# Patient Record
Sex: Male | Born: 1960 | Race: White | Hispanic: No | Marital: Married | Smoking: Never smoker
Health system: Southern US, Community
[De-identification: ages and names within clinical notes are randomized; demographics above are authoritative.]

## PROBLEM LIST (undated history)

## (undated) DIAGNOSIS — M1990 Unspecified osteoarthritis, unspecified site: Secondary | ICD-10-CM

## (undated) DIAGNOSIS — T7840XA Allergy, unspecified, initial encounter: Secondary | ICD-10-CM

## (undated) DIAGNOSIS — I10 Essential (primary) hypertension: Secondary | ICD-10-CM

## (undated) DIAGNOSIS — E785 Hyperlipidemia, unspecified: Secondary | ICD-10-CM

## (undated) DIAGNOSIS — I6529 Occlusion and stenosis of unspecified carotid artery: Secondary | ICD-10-CM

## (undated) DIAGNOSIS — Z9289 Personal history of other medical treatment: Secondary | ICD-10-CM

## (undated) HISTORY — PX: OTHER SURGICAL HISTORY: SHX169

## (undated) HISTORY — DX: Personal history of other medical treatment: Z92.89

## (undated) HISTORY — DX: Essential (primary) hypertension: I10

## (undated) HISTORY — DX: Hyperlipidemia, unspecified: E78.5

## (undated) HISTORY — DX: Occlusion and stenosis of unspecified carotid artery: I65.29

## (undated) HISTORY — DX: Unspecified osteoarthritis, unspecified site: M19.90

## (undated) HISTORY — DX: Allergy, unspecified, initial encounter: T78.40XA

## (undated) HISTORY — PX: TONSILLECTOMY: SUR1361

---

## 2003-03-13 ENCOUNTER — Encounter: Admission: RE | Admit: 2003-03-13 | Discharge: 2003-03-13 | Payer: Self-pay | Admitting: Emergency Medicine

## 2003-03-13 ENCOUNTER — Encounter: Payer: Self-pay | Admitting: Emergency Medicine

## 2011-08-31 ENCOUNTER — Ambulatory Visit (INDEPENDENT_AMBULATORY_CARE_PROVIDER_SITE_OTHER): Payer: 59 | Admitting: Physician Assistant

## 2011-08-31 VITALS — BP 155/81 | HR 73 | Temp 98.8°F | Resp 18 | Ht 65.5 in | Wt 226.4 lb

## 2011-08-31 DIAGNOSIS — J40 Bronchitis, not specified as acute or chronic: Secondary | ICD-10-CM

## 2011-08-31 DIAGNOSIS — R059 Cough, unspecified: Secondary | ICD-10-CM

## 2011-08-31 DIAGNOSIS — R05 Cough: Secondary | ICD-10-CM

## 2011-08-31 MED ORDER — AZITHROMYCIN 250 MG PO TABS: ORAL_TABLET | ORAL | Status: AC

## 2011-08-31 MED ORDER — IPRATROPIUM BROMIDE 0.06 % NA SOLN: 2.0000 | Freq: Three times a day (TID) | NASAL | Status: DC

## 2011-08-31 MED ORDER — HYDROCODONE-HOMATROPINE 5-1.5 MG/5ML PO SYRP: ORAL_SOLUTION | ORAL | Status: AC

## 2011-08-31 NOTE — Progress Notes (Signed)
Patient ID: Alexander Lamb MRN: [id number], DOB: Aug 25, 1960, 51 y.o. Date of Encounter: 08/31/2011, 10:32 AM  Primary Physician: Deboraha Sprang at [facility]  Chief Complaint:  Chief Complaint  Patient presents with  . Cough    x 5 days non productive  . Sore Throat  . Nasal Congestion    yellow mucous    HPI: 51 y.o. year old male presents with 5 day history of worsening sore throat, nasal congestion, post nasal drip, and cough. Afebrile. No chills. Nasal congestion thick and yellow. Cough nonproductive, worse as the day progresses. No wheeze or SOB. Mild sinus pressure. Normal hearing. Normal appetite. No GI complaints.   No leg trauma, sedentary periods, h/o cancer, or tobacco use.  No past medical history on file.   Home Meds: Prior to Admission medications   Medication Sig Start Date End Date Taking? Authorizing Provider  clomiPHENE (CLOMID) 50 MG tablet Take 50 mg by mouth daily.   Yes Historical Provider, MD  ibuprofen (ADVIL,MOTRIN) 200 MG tablet Take 200 mg by mouth as needed.   Yes Historical Provider, MD  naproxen sodium (ANAPROX) 220 MG tablet Take 220 mg by mouth as needed.   Yes Historical Provider, MD  simvastatin (ZOCOR) 20 MG tablet Take 20 mg by mouth every evening.   Yes Historical Provider, MD    Allergies: No Known Allergies  History   Social History  . Marital Status: Married    Spouse Name: N/A    Number of Children: N/A  . Years of Education: N/A   Occupational History  . Not on file.   Social History Main Topics  . Smoking status: Never Smoker   . Smokeless tobacco: Not on file  . Alcohol Use: Not on file  . Drug Use: Not on file  . Sexually Active: Not on file   Other Topics Concern  . Not on file   Social History Narrative  . No narrative on file     Review of Systems: Constitutional: negative for chills, fever, night sweats or weight changes Cardiovascular: negative for chest pain or palpitations Respiratory: negative for hemoptysis,  wheezing, or shortness of breath Abdominal: negative for abdominal pain, nausea, vomiting or diarrhea Dermatological: negative for rash Neurologic: negative for headache   Physical Exam: Blood pressure 155/81, pulse 73, temperature 98.8 F (37.1 C), temperature source Oral, resp. rate 18, height 5' 5.5" (1.664 m), weight 226 lb 6.4 oz (102.694 kg), SpO2 99.00%., Body mass index is 37.10 kg/(m^2). General: Well developed, well nourished, in no acute distress. Head: Normocephalic, atraumatic, eyes without discharge, sclera non-icteric, nares are congested. Bilateral auditory canals clear, TM's are without perforation, pearly grey with reflective cone of light bilaterally. Serous effusion bilaterally behind TM's. Maxillary sinus TTP. Oral cavity moist, dentition normal. Posterior pharynx with post nasal drip and mild erythema. No peritonsillar abscess or tonsillar exudate. Neck: Supple. No thyromegaly. Full ROM. No lymphadenopathy. Lungs: Clear bilaterally to auscultation without wheezes, rales, or rhonchi. Breathing is unlabored. Heart: RRR with S1 S2. No murmurs, rubs, or gallops appreciated. Msk:  Strength and tone normal for age. Extremities: No clubbing or cyanosis. No edema. Neuro: Alert and oriented X 3. Moves all extremities spontaneously. CNII-XII grossly in tact. Psych:  Responds to questions appropriately with a normal affect.     ASSESSMENT AND PLAN:  51 y.o. year old male with early bronchitis -Azithromycin 250 MG #6 2 po first day then 1 po next 4 days no RF -Atrovent NS 0.06% 2 sprays each nare  bid prn #1 no RF -Hycodan #4oz 1 tsp po q 4-6 hours prn cough no RF SED -Mucinex -Tylenol/Motrin prn -Rest/fluids -RTC precautions -RTC 3-5 days if no improvement  Signed, Eula Listen, PA-C 08/31/2011 10:32 AM

## 2012-02-07 ENCOUNTER — Ambulatory Visit (INDEPENDENT_AMBULATORY_CARE_PROVIDER_SITE_OTHER): Payer: 59 | Admitting: Family Medicine

## 2012-02-07 VITALS — BP 125/72 | HR 69 | Temp 98.4°F | Resp 16 | Ht 65.5 in | Wt 222.0 lb

## 2012-02-07 DIAGNOSIS — R300 Dysuria: Secondary | ICD-10-CM

## 2012-02-07 LAB — POCT URINALYSIS DIPSTICK
Bilirubin, UA: NEGATIVE
Blood, UA: NEGATIVE
Glucose, UA: NEGATIVE
Ketones, UA: NEGATIVE
Leukocytes, UA: NEGATIVE
Nitrite, UA: NEGATIVE
Protein, UA: NEGATIVE
Spec Grav, UA: 1.01
Urobilinogen, UA: 0.2
pH, UA: 6.5

## 2012-02-07 LAB — POCT UA - MICROSCOPIC ONLY
Bacteria, U Microscopic: NEGATIVE
Casts, Ur, LPF, POC: NEGATIVE
Crystals, Ur, HPF, POC: NEGATIVE
Mucus, UA: NEGATIVE
RBC, urine, microscopic: NEGATIVE
Yeast, UA: NEGATIVE

## 2012-02-07 MED ORDER — CIPROFLOXACIN HCL 500 MG PO TABS: 500.0000 mg | ORAL_TABLET | Freq: Two times a day (BID) | ORAL | Status: AC

## 2012-02-07 NOTE — Progress Notes (Signed)
[facility] [address], [place] [address] [phone]  Date:  02/07/2012   Name:  Alexander Lamb   DOB:  [birth year]   MRN:  [id number]  PCP:  No primary provider on file.    Chief Complaint: Dysuria   History of Present Illness:  Alexander Lamb is a 51 y.o. very pleasant male patient who presents with the following:  He is concerned that he may have a UTI.  He noted dysuria and urgency/ frequency last night for about an hour.  It then seemed to get better and currently he feels ok.   He has had a UTI in the past- once or twice. Once was found incidentally on a UA.   He has felt a little tired today. No fever or chills.    He does PT at a SNF.   His back feels a little tight- this is not new however.  He is little queasy, but no vomiting or abd pain.    He has not noted any penile discharge, no new sexual partners.   No history of kidney stones- no family history of same that he knows of There is no problem list on file for this patient.   No past medical history on file.  No past surgical history on file.  History  Substance Use Topics  . Smoking status: Never Smoker   . Smokeless tobacco: Not on file  . Alcohol Use: Not on file    No family history on file.  Allergies  Allergen Reactions  . Penicillins Rash    Medication list has been reviewed and updated.  Current Outpatient Prescriptions on File Prior to Visit  Medication Sig Dispense Refill  . clomiPHENE (CLOMID) 50 MG tablet Take 50 mg by mouth daily.      . simvastatin (ZOCOR) 20 MG tablet Take 20 mg by mouth every evening.        Review of Systems:  As per HPI- otherwise negative.   Physical Examination: Filed Vitals:   02/07/12 0857  BP: 125/72  Pulse: 69  Temp: 98.4 F (36.9 C)  Resp: 16   Filed Vitals:   02/07/12 0857  Height: 5' 5.5" (1.664 m)  Weight: 222 lb (100.699 kg)   Body mass index is 36.38 kg/(m^2). Ideal Body Weight: Weight in (lb) to have BMI =  25: 152.2   GEN: WDWN, NAD, Non-toxic, A & O x 3 HEENT: Atraumatic, Normocephalic. Neck supple. No masses, No LAD. Ears and Nose: No external deformity. CV: RRR, No M/G/R. No JVD. No thrill. No extra heart sounds. PULM: CTA B, no wheezes, crackles, rhonchi. No retractions. No resp. distress. No accessory muscle use. ABD: S, NT, ND, +BS. No rebound. No HSM.  No CVA tenderness GU: normal, no masses or discharge EXTR: No c/c/e NEURO Normal gait.  PSYCH: Normally interactive. Conversant. Not depressed or anxious appearing.  Calm demeanor.   Results for orders placed in visit on 02/07/12  POCT URINALYSIS DIPSTICK      Component Value Range   Color, UA yellow     Clarity, UA clear     Glucose, UA neg     Bilirubin, UA neg     Ketones, UA neg     Spec Grav, UA 1.010     Blood, UA neg     pH, UA 6.5     Protein, UA neg     Urobilinogen, UA 0.2     Nitrite, UA neg  Leukocytes, UA Negative    POCT UA - MICROSCOPIC ONLY      Component Value Range   WBC, Ur, HPF, POC rare     RBC, urine, microscopic neg     Bacteria, U Microscopic neg     Mucus, UA neg     Epithelial cells, urine per micros rare     Crystals, Ur, HPF, POC neg     Casts, Ur, LPF, POC neg     Yeast, UA neg       Assessment and Plan: 1. Dysuria  POCT urinalysis dipstick, POCT UA - Microscopic Only, ciprofloxacin (CIPRO) 500 MG tablet, Urine culture   [name] may have passed a stone vs early UTI.  Will start cipro while we await his urine culture, he will also use a urine screen if his symptoms return.  Will plan further follow- up pending labs. Let me know if any problems in the meantime.    Abbe Amsterdam, MD

## 2012-02-08 LAB — URINE CULTURE
Colony Count: NO GROWTH
Organism ID, Bacteria: NO GROWTH

## 2012-02-10 ENCOUNTER — Encounter: Payer: Self-pay | Admitting: Family Medicine

## 2012-06-09 ENCOUNTER — Ambulatory Visit (INDEPENDENT_AMBULATORY_CARE_PROVIDER_SITE_OTHER): Payer: 59 | Admitting: Family Medicine

## 2012-06-09 VITALS — BP 118/76 | HR 82 | Temp 98.7°F | Resp 18 | Ht 66.0 in | Wt 217.0 lb

## 2012-06-09 DIAGNOSIS — K529 Noninfective gastroenteritis and colitis, unspecified: Secondary | ICD-10-CM

## 2012-06-09 DIAGNOSIS — K5289 Other specified noninfective gastroenteritis and colitis: Secondary | ICD-10-CM

## 2012-06-09 DIAGNOSIS — R110 Nausea: Secondary | ICD-10-CM

## 2012-06-09 DIAGNOSIS — R109 Unspecified abdominal pain: Secondary | ICD-10-CM

## 2012-06-09 DIAGNOSIS — R197 Diarrhea, unspecified: Secondary | ICD-10-CM

## 2012-06-09 MED ORDER — ONDANSETRON 4 MG PO TBDP: 4.0000 mg | ORAL_TABLET | Freq: Three times a day (TID) | ORAL | Status: DC | PRN

## 2012-06-09 MED ORDER — DICYCLOMINE HCL 10 MG PO CAPS: 10.0000 mg | ORAL_CAPSULE | Freq: Three times a day (TID) | ORAL | Status: DC

## 2012-06-09 NOTE — Patient Instructions (Signed)
Drink plenty of fluids  Gradually progress diet as discussed  Bentyl 10 mg (20 if needed) 4 times daily and for cramping or diarrhea  Return if abruptly worse

## 2012-06-09 NOTE — Progress Notes (Signed)
Subjective The 51 year old man is here for an intestinal in fraction. He started getting sick Saturday afternoon with nausea and vomiting. He last vomited on Sunday morning. He is persisted in having diarrhea, about 8 times yesterday and once this morning. He has been cramping intermittently. He ate some Jell-O and drink liquids yesterday. 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  Objective: Pleasant alert man in no major distress. Throat clear. Mucous membranes are moist.. Soft without masses. Mild generalized tenderness. Skin is warm and dry.  Assessment: Gastroenteritis. With nausea diarrhea and cramping  Plan: Bentyl 10 mg 4 times a day when necessary  Zofran for when necessary use  Progress diet slowly.  Return if problems.

## 2012-10-31 ENCOUNTER — Ambulatory Visit: Payer: Self-pay | Admitting: Cardiovascular Disease

## 2013-01-01 ENCOUNTER — Ambulatory Visit (INDEPENDENT_AMBULATORY_CARE_PROVIDER_SITE_OTHER): Payer: BC Managed Care – PPO | Admitting: Cardiovascular Disease

## 2013-01-01 ENCOUNTER — Encounter: Payer: Self-pay | Admitting: Cardiovascular Disease

## 2013-01-01 VITALS — BP 142/92 | HR 67 | Ht 67.0 in | Wt 226.4 lb

## 2013-01-01 DIAGNOSIS — E782 Mixed hyperlipidemia: Secondary | ICD-10-CM

## 2013-01-01 LAB — HEPATIC FUNCTION PANEL
ALT: 44 U/L (ref 0–53)
AST: 59 U/L — ABNORMAL HIGH (ref 0–37)
Albumin: 4.1 g/dL (ref 3.5–5.2)
Alkaline Phosphatase: 46 U/L (ref 39–117)
Bilirubin, Direct: 0.3 mg/dL (ref 0.0–0.3)
Total Bilirubin: 1.5 mg/dL — ABNORMAL HIGH (ref 0.3–1.2)
Total Protein: 7.3 g/dL (ref 6.0–8.3)

## 2013-01-01 LAB — LIPID PANEL
Cholesterol: 162 mg/dL (ref 0–200)
HDL: 40.2 mg/dL (ref >39.00)
LDL Cholesterol: 87 mg/dL (ref 0–99)
Total CHOL/HDL Ratio: 4
Triglycerides: 173 mg/dL — ABNORMAL HIGH (ref 0.0–149.0)
VLDL: 34.6 mg/dL (ref 0.0–40.0)

## 2013-01-01 LAB — BASIC METABOLIC PANEL
BUN: 20 mg/dL (ref 6–23)
CO2: 23 mEq/L (ref 19–32)
Calcium: 8.6 mg/dL (ref 8.4–10.5)
Chloride: 107 mEq/L (ref 96–112)
Creatinine, Ser: 1.2 mg/dL (ref 0.4–1.5)
GFR: 69.61 mL/min (ref >60.00)
Glucose, Bld: 117 mg/dL — ABNORMAL HIGH (ref 70–99)
Potassium: 4.8 mEq/L (ref 3.5–5.1)
Sodium: 140 mEq/L (ref 135–145)

## 2013-01-01 NOTE — Patient Instructions (Addendum)
Your physician recommends that you have a FASTING LIPID, LIVER and BMP today.  Your physician wants you to follow-up in: 3 MONTHS with Dr Excell Seltzer.  You will receive a reminder letter in the mail two months in advance. If you don't receive a letter, please call our office to schedule the follow-up appointment.  Your physician recommends that you return for a FASTING lipid profile in 3 MONTHS--nothing to eat or drink after midnight, lab opens at 7:30 (please have labs one week prior to follow-up appointment)

## 2013-01-01 NOTE — Progress Notes (Signed)
HPI:  52 year old gentleman presents for cardiovascular evaluation. The patient has hyperlipidemia and has been treated with simvastatin since 2007. He's never had cardiac problems before. He doesn't think labs have been drawn in the past few years and I cannot find any record of his lipids. He's been noted to have low testosterone and is being treated with clomiphene. He is referred for consideration of whether he needs to continue on statin therapy as this is been associated with low testosterone levels.  The patient feels much better since starting clomiphene. His energy level is dramatically improved. He does power lifting and fairly hard exercise. He denies exertional chest pain or pressure, palpitations, or lightheadedness. He has expected dyspnea with heavy exertion, but no shortness of breath with normal or moderate level activity. He essentially feels well and is without complaints. He denies edema, orthopnea, or PND. He has no history of chest pain, myocardial infarction, stroke, or TIA. There is no family history of premature coronary artery disease. However, he admits his father's medical history is completely unknown as he has had no contact with him.  Outpatient Encounter Prescriptions as of 01/01/2013  Medication Sig Dispense Refill  . aspirin-acetaminophen-caffeine (EXCEDRIN MIGRAINE) 250-250-65 MG per tablet Take 1 tablet by mouth every 6 (six) hours as needed for pain.      . clomiPHENE (CLOMID) 50 MG tablet Take 50 mg by mouth daily.      . Melatonin 5 MG TABS Take 5 mg by mouth at bedtime as needed.      . naproxen sodium (ANAPROX) 220 MG tablet Take 220 mg by mouth as needed.      . S-Adenosylmethionine (SAM-E) 200 MG TABS Take 400 mg by mouth once a week.      . simvastatin (ZOCOR) 20 MG tablet Take 20 mg by mouth every evening.      . vitamin B-12 (CYANOCOBALAMIN) 50 MCG tablet Take 50 mcg by mouth daily.      . [DISCONTINUED] dicyclomine (BENTYL) 10 MG capsule Take 1 capsule  (10 mg total) by mouth 4 (four) times daily -  before meals and at bedtime.  20 capsule  0  . [DISCONTINUED] ondansetron (ZOFRAN ODT) 4 MG disintegrating tablet Take 1 tablet (4 mg total) by mouth every 8 (eight) hours as needed for nausea.  10 tablet  0   No facility-administered encounter medications on file as of 01/01/2013.    Penicillins  Past Medical History  Diagnosis Date  . Allergy   . Arthritis   . Hyperlipidemia     History reviewed. No pertinent past surgical history.  History   Social History  . Marital Status: Married    Spouse Name: N/A    Number of Children: N/A  . Years of Education: N/A   Occupational History  . Not on file.   Social History Main Topics  . Smoking status: Never Smoker   . Smokeless tobacco: Not on file  . Alcohol Use: 0.0 oz/week    1-2 drink(s) per week  . Drug Use: No  . Sexually Active: Yes    Birth Control/ Protection: None   Other Topics Concern  . Not on file   Social History Narrative  . No narrative on file    Family History  Problem Relation Age of Onset  . Arthritis Mother   . COPD Mother   . Fibromyalgia Mother   . Cancer Maternal Grandmother     ROS:  General: no fevers/chills/night sweats, weight has been stable  for many years Eyes: no blurry vision, diplopia, or amaurosis ENT: no sore throat or hearing loss Resp: no cough, wheezing, or hemoptysis CV: no edema or palpitations GI: no abdominal pain, nausea, vomiting, diarrhea, or constipation GU: no dysuria, frequency, or hematuria Skin: no rash Neuro: no headache, numbness, tingling, or weakness of extremities Musculoskeletal: Positive for left hip Heme: no bleeding, DVT, or easy bruising Endo: no polydipsia or polyuria  BP 142/92  Pulse 67  Ht 5\' 7"  (1.702 m)  Wt 226 lb 6.4 oz (102.694 kg)  BMI 35.45 kg/m2  PHYSICAL EXAM: Pt is alert and oriented, WD, WN, in no distress. HEENT: normal Neck: JVP normal. Carotid upstrokes normal without bruits. No  thyromegaly. Lungs: equal expansion, clear bilaterally CV: Apex is discrete and nondisplaced, RRR without murmur or gallop Abd: soft, NT, +BS, no bruit, no hepatosplenomegaly Back: no CVA tenderness Ext: no C/C/E        Femoral pulses 2+= without bruits        DP/PT pulses intact and = Skin: warm and dry without rash Neuro: CNII-XII intact             Strength intact = bilaterally  EKG:  Normal sinus rhythm 67 beats per minute, within normal limits  ASSESSMENT AND PLAN: Hyperlipidemia. Lipids are unknown, but the patient is on chronic simvastatin. We'll check a lipid panel and a full metabolic panel today. This will help guide up plan with consideration of discontinuing simvastatin. I have reviewed the balance between the cardiovascular risk of statin therapy and primary prevention with some of the negative effects including association with low testosterone. The patient has also noted hyperglycemia in the past and we will check his fasting glucose. As long as his lipids are reasonable, will consider a trial off of statin therapy with repeat labs and followup visit in 3 months for further discussion. We had extensive discussion about a shift from heavy weight lifting towards more aerobic exercise as well as a focus on dietary changes and weight loss.  Alexander Lamb 01/01/2013 10:19 AM

## 2013-01-06 ENCOUNTER — Other Ambulatory Visit: Payer: Self-pay

## 2013-01-06 DIAGNOSIS — E782 Mixed hyperlipidemia: Secondary | ICD-10-CM

## 2013-01-08 ENCOUNTER — Other Ambulatory Visit: Payer: Self-pay

## 2013-01-08 ENCOUNTER — Encounter: Payer: Self-pay | Admitting: Cardiovascular Disease

## 2013-01-08 NOTE — Telephone Encounter (Signed)
New problem  Pt is calling regarding lab work.

## 2013-01-08 NOTE — Telephone Encounter (Signed)
This encounter was created in error - please disregard.

## 2013-05-06 ENCOUNTER — Telehealth: Payer: Self-pay | Admitting: Cardiovascular Disease

## 2013-05-06 DIAGNOSIS — E782 Mixed hyperlipidemia: Secondary | ICD-10-CM

## 2013-05-06 NOTE — Telephone Encounter (Signed)
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  One of the NPs took the patient's BP which was 170/110 in right arm and higher in left arm but he cannot remember number.  Patient was taken to urgent care where his BP was verified as being 170/110.  Patient sat and rested for an hour; BP was rechecked and was 138/85.  Patient states he is feeling better now but would like to be seen in the office soon.  I advised patient that Dr. Excell Seltzer does not have any appointments until January.  I scheduled patient with Tereso Newcomer, PA-C on 11/12 and for fasting lipid profile same day, which Dr. Excell Seltzer wanted to repeat in October.  Patient verbalized understanding and agreement and thanked me for my help.

## 2013-05-06 NOTE — Telephone Encounter (Signed)
New Problem     11/5 pt felt sick today flushed face, tired, dizzines, headache,  BP 170/110 right arm.    Pt would like to sees some one cell # [phone].   Co worker took pt to 11/5 [facility] # [phone] in [place] today they did EKG/BP.   Pt needs and appointment.

## 2013-05-07 ENCOUNTER — Encounter: Payer: Self-pay | Admitting: *Deleted

## 2013-05-13 ENCOUNTER — Ambulatory Visit (INDEPENDENT_AMBULATORY_CARE_PROVIDER_SITE_OTHER): Payer: BC Managed Care – PPO | Admitting: Physician Assistant

## 2013-05-13 ENCOUNTER — Ambulatory Visit (HOSPITAL_COMMUNITY): Payer: BC Managed Care – PPO | Attending: Cardiovascular Disease

## 2013-05-13 ENCOUNTER — Encounter: Payer: Self-pay | Admitting: Physician Assistant

## 2013-05-13 ENCOUNTER — Other Ambulatory Visit: Payer: BC Managed Care – PPO

## 2013-05-13 VITALS — BP 140/80 | HR 72 | Ht 67.0 in | Wt 200.0 lb

## 2013-05-13 DIAGNOSIS — R0989 Other specified symptoms and signs involving the circulatory and respiratory systems: Secondary | ICD-10-CM

## 2013-05-13 DIAGNOSIS — IMO0001 Reserved for inherently not codable concepts without codable children: Secondary | ICD-10-CM

## 2013-05-13 DIAGNOSIS — R011 Cardiac murmur, unspecified: Secondary | ICD-10-CM

## 2013-05-13 DIAGNOSIS — I6529 Occlusion and stenosis of unspecified carotid artery: Secondary | ICD-10-CM

## 2013-05-13 DIAGNOSIS — E785 Hyperlipidemia, unspecified: Secondary | ICD-10-CM

## 2013-05-13 DIAGNOSIS — I10 Essential (primary) hypertension: Secondary | ICD-10-CM | POA: Insufficient documentation

## 2013-05-13 DIAGNOSIS — R030 Elevated blood-pressure reading, without diagnosis of hypertension: Secondary | ICD-10-CM

## 2013-05-13 DIAGNOSIS — I658 Occlusion and stenosis of other precerebral arteries: Secondary | ICD-10-CM | POA: Insufficient documentation

## 2013-05-13 LAB — HEPATIC FUNCTION PANEL
ALT: 51 U/L (ref 0–53)
AST: 60 U/L — ABNORMAL HIGH (ref 0–37)
Albumin: 4.3 g/dL (ref 3.5–5.2)
Alkaline Phosphatase: 55 U/L (ref 39–117)
Bilirubin, Direct: 0.1 mg/dL (ref 0.0–0.3)
Total Bilirubin: 1.2 mg/dL (ref 0.3–1.2)
Total Protein: 7.3 g/dL (ref 6.0–8.3)

## 2013-05-13 LAB — LIPID PANEL
Cholesterol: 205 mg/dL — ABNORMAL HIGH (ref 0–200)
HDL: 50.9 mg/dL (ref >39.00)
Total CHOL/HDL Ratio: 4
Triglycerides: 114 mg/dL (ref 0.0–149.0)
VLDL: 22.8 mg/dL (ref 0.0–40.0)

## 2013-05-13 LAB — BASIC METABOLIC PANEL
BUN: 18 mg/dL (ref 6–23)
CO2: 28 mEq/L (ref 19–32)
Calcium: 9.1 mg/dL (ref 8.4–10.5)
Chloride: 103 mEq/L (ref 96–112)
Creatinine, Ser: 1.1 mg/dL (ref 0.4–1.5)
GFR: 74.64 mL/min (ref >60.00)
Glucose, Bld: 92 mg/dL (ref 70–99)
Potassium: 3.8 mEq/L (ref 3.5–5.1)
Sodium: 139 mEq/L (ref 135–145)

## 2013-05-13 LAB — LDL CHOLESTEROL, DIRECT: Direct LDL: 140 mg/dL

## 2013-05-13 NOTE — Progress Notes (Signed)
[address] [place], [place]  [address] Phone: [phone] Fax:  [phone]  Date:  05/13/2013   ID:  Alexander Lamb, DOB 05/16/1961, MRN [id number]  PCP:  Janace Hoard, MD  Cardiologist:  Dr. Tonny Bollman     History of Present Illness: Alexander Lamb is a 52 y.o. male with a history of hyperlipidemia and hypogonadism.  He was initially evaluated by Dr. Excell Seltzer in 12/2012.  He had been on simvastatin. Decision was made to stop this and repeat his labs in 3 months. Of note, his sugar was mildly elevated and diet and weight loss was stressed.  He called in recently with complaints of elevated blood pressure while at work (170/110). He was symptomatic with facial flushing, dizziness and headache.  Patient tells me that he had recently started on a creatine supplement to augment his weight training. He also has been taking NSAIDs on a fairly frequent basis. He had his flu shot about 2 days before his episode of hypertension. He started to feel poorly 2 days after his vaccination. As noted, he did have nausea and headaches with his increased blood pressure. He was taken to an urgent care but no other significant testing was performed. Patient denies associated chest pain or shortness of breath. He denies syncope, orthopnea, PND or edema. He denies exertional chest discomfort.  Recent Labs: 01/01/2013: ALT 44; Creatinine 1.2; HDL 40.20; LDL (calc) 87; Potassium 4.8   Wt Readings from Last 3 Encounters:  05/13/13 200 lb (90.719 kg)  01/01/13 226 lb 6.4 oz (102.694 kg)  06/09/12 217 lb (98.431 kg)     Past Medical History  Diagnosis Date  . Allergy   . Arthritis   . Hyperlipidemia     Current Outpatient Prescriptions  Medication Sig Dispense Refill  . aspirin-acetaminophen-caffeine (EXCEDRIN MIGRAINE) 250-250-65 MG per tablet Take 1 tablet by mouth every 6 (six) hours as needed for pain.      . clomiPHENE (CLOMID) 50 MG tablet Take 50 mg by mouth daily.      . Melatonin 5  MG TABS Take 5 mg by mouth at bedtime as needed.      . naproxen sodium (ANAPROX) 220 MG tablet Take 220 mg by mouth as needed.      . S-Adenosylmethionine (SAM-E) 200 MG TABS Take 400 mg by mouth once a week.      . vitamin B-12 (CYANOCOBALAMIN) 50 MCG tablet Take 50 mcg by mouth daily.       No current facility-administered medications for this visit.    Allergies:   Penicillins   Social History:  The patient  reports that he has never smoked. He does not have any smokeless tobacco history on file. He reports that he drinks alcohol. He reports that he does not use illicit drugs.   Family History:  The patient's family history includes Arthritis in his mother; COPD in his mother; Cancer in his maternal grandmother; Fibromyalgia in his mother.   ROS:  Please see the history of present illness.   He had a URI about 2 weeks ago.   All other systems reviewed and negative.   PHYSICAL EXAM: VS:  BP 140/80  Pulse 72  Ht 5\' 7"  (1.702 m)  Wt 200 lb (90.719 kg)  BMI 31.32 kg/m2 Well nourished, well developed, in no acute distress HEENT: normal Neck: no JVD Vascular:? Faint left carotid bruit Cardiac:  normal S1, S2; RRR; 1/6 systolic murmur at the RUSB Lungs:  clear to  auscultation bilaterally, no wheezing, rhonchi or rales Abd: soft, nontender, no hepatomegaly Ext: no edema Skin: warm and dry Neuro:  CNs 2-12 intact, no focal abnormalities noted  EKG:  NSR, HR 72, normal axis, no ischemic changes     ASSESSMENT AND PLAN:  1. Elevated Blood Pressure: I suspect his episode of elevated blood pressure last week was multifactorial. He had recently started a new creatine supplement. He had a recent flu shot and was also taking NSAIDs on a regular basis. In looking through his chart, his blood pressures have been borderline elevated for some time now. I suspect he likely has underlying essential hypertension. He has a family history of this. His blood pressures have remained fairly stable even  with losing 20+ pounds.  He has stopped using the creatine supplement. He is limiting his use of NSAIDs.  I have asked him to continue to watch his blood pressures over the next 2 weeks. He will send [place] a copy of his recordings. If his pressure remains greater than 140/90, I would suggest starting him on a low-dose thiazide diuretic.  Check a basic metabolic panel. 2. Cardiac Murmur: Obtain an echocardiogram. This will also allow me to assess for LVH. 3. Left Carotid Bruit: There is a suggestion of a left carotid bruit. He has had a sensation of hearing his heartbeat at night. I will obtain carotid US. 4. Hyperlipidemia:  He has follow up labs due today. 5. Disposition:  Arrange follow up with Dr. Excell Seltzer or me in 4 weeks.  Signed, Tereso Newcomer, PA-C  05/13/2013 12:30 PM

## 2013-05-13 NOTE — Patient Instructions (Signed)
LAB TODAY BMET, LIPID AND LIVER PANEL  PLEASE SCHEDULE ECHO  PLEASE SCHEDULE FOR CAROTID DOPPLERS  PLEASE CHECK YOUR BLOOD PRESSURE FOR THE NEXT 2 WEEKS AND CALL us WITH READINGS [phone] SCOTT WEAVER, PAC  PLEASE FOLLOW UP WITH DR. Excell Seltzer SAME DAY SCOTT WEAVER, PAC

## 2013-05-15 ENCOUNTER — Encounter: Payer: Self-pay | Admitting: Physician Assistant

## 2013-05-15 ENCOUNTER — Telehealth: Payer: Self-pay | Admitting: *Deleted

## 2013-05-15 NOTE — Telephone Encounter (Signed)
pt's wife aware of all test results. Advised per Dr. Excell Seltzer and Bing Neighbors. PA pt to be started on Atorvastatin 40 mg daily; wife states that pt is NOT going to want to take a statin and they have done reseach and PCP has said no don't take statins. 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 Wife states pt used to take lipitor once before and it raised his blood sugar and PCP don't take it. I explained we are trying to keep the plaque from building up any further in the carotids. I stated I understand about the blood sugar however; there is bilateral plaque in the carotids 40-59 % and we are tyring to keep the pt from having a stroke; I apologized to pt's wife and said that I did not mean to be blunt about it, not all doctors will agree on everything as well. Mrs. Pyon agreed with me. I said all I can do is s/w Dr. Excell Seltzer and Bing Neighbors. PA to about this concern and see if there is some other alternative and that I will try and cb on Monday or Tuesday, pt's wife said ok and thank you. I will forward this message to Dr. Excell Seltzer, Tereso Newcomer, PAC and Julieta Gutting, RN.

## 2013-05-15 NOTE — Telephone Encounter (Signed)
lmptcb on botht he home and cell #'s for test results

## 2013-05-21 ENCOUNTER — Encounter (HOSPITAL_COMMUNITY): Payer: BC Managed Care – PPO

## 2013-05-26 ENCOUNTER — Ambulatory Visit (HOSPITAL_COMMUNITY): Payer: BC Managed Care – PPO | Attending: Physician Assistant | Admitting: Radiology

## 2013-05-26 DIAGNOSIS — R011 Cardiac murmur, unspecified: Secondary | ICD-10-CM

## 2013-05-26 DIAGNOSIS — E785 Hyperlipidemia, unspecified: Secondary | ICD-10-CM | POA: Insufficient documentation

## 2013-05-26 NOTE — Progress Notes (Signed)
Echocardiogram performed.  

## 2013-05-27 ENCOUNTER — Encounter: Payer: Self-pay | Admitting: Physician Assistant

## 2013-06-09 ENCOUNTER — Ambulatory Visit (INDEPENDENT_AMBULATORY_CARE_PROVIDER_SITE_OTHER): Payer: BC Managed Care – PPO | Admitting: Physician Assistant

## 2013-06-09 ENCOUNTER — Encounter: Payer: Self-pay | Admitting: Physician Assistant

## 2013-06-09 VITALS — BP 150/80 | HR 78 | Ht 67.0 in | Wt 212.0 lb

## 2013-06-09 DIAGNOSIS — E785 Hyperlipidemia, unspecified: Secondary | ICD-10-CM

## 2013-06-09 DIAGNOSIS — I10 Essential (primary) hypertension: Secondary | ICD-10-CM

## 2013-06-09 DIAGNOSIS — I6529 Occlusion and stenosis of unspecified carotid artery: Secondary | ICD-10-CM

## 2013-06-09 MED ORDER — LISINOPRIL 10 MG PO TABS: 10.0000 mg | ORAL_TABLET | Freq: Every day | ORAL | Status: DC

## 2013-06-09 MED ORDER — ROSUVASTATIN CALCIUM 10 MG PO TABS: 10.0000 mg | ORAL_TABLET | Freq: Every day | ORAL | Status: DC

## 2013-06-09 NOTE — Patient Instructions (Addendum)
Your physician assistant recommends that you schedule a follow-up appointment in: 3 months WITH Nana Vastine PA WHEN DR COOPER SCHEDULED  LAB WORK TO BE DONE IN 1 WEEK BMET  LAB WORK TO BE DONE IN EIGHT WEEKS LIPID AND LIVER  START LISINOPRIL 10 MG DAILY   START CRESTOR 10 MG DAILY

## 2013-06-09 NOTE — Progress Notes (Signed)
[address] [place], [place]  [address] Phone: [phone] Fax:  [phone]  Date:  06/09/2013   ID:  Alexander Lamb, DOB February 08, 1961, MRN [id number]  PCP:  Janace Hoard, MD  Cardiologist:  Dr. Tonny Bollman     History of Present Illness: Alexander Lamb is a 52 y.o. male with a history of hyperlipidemia and hypogonadism.  He was initially evaluated by Dr. Excell Seltzer in 12/2012.  He had been on simvastatin. Decision was made to stop this and repeat his labs in 3 months. Of note, his sugar was mildly elevated and diet and weight loss was stressed.  He called in recently with complaints of elevated blood pressure while at work (170/110). He was symptomatic with facial flushing, dizziness and headache.  I saw him 05/13/13.  He had a carotid bruit on exam. Carotid US demonstrated 40-59% bilateral ICA stenosis.  Placed him on atorvastatin for treatment of his dyslipidemia and vascular disease.  He also had a murmur. Echocardiogram (05/2013): EF 55-60%, normal wall motion, grade 1 diastolic dysfunction, mild LAE.  He was to keep an eye on his blood pressures and for followup today.    He brings in a list of his BPs.  They continue to remain > 140/90.  He did not start Atorvastatin due to concerns over blood sugar.  The patient denies chest pain, shortness of breath, syncope, orthopnea, PND or significant pedal edema.   Recent Labs: 01/01/2013: LDL (calc) 87  05/13/2013: ALT 51; Creatinine 1.1; Direct LDL 140.0; HDL 50.90; Potassium 3.8   Wt Readings from Last 3 Encounters:  06/09/13 212 lb (96.163 kg)  05/13/13 200 lb (90.719 kg)  01/01/13 226 lb 6.4 oz (102.694 kg)     Past Medical History  Diagnosis Date  . Allergy   . Arthritis   . Hyperlipidemia   . Carotid stenosis     Carotid US (05/2013): Bilateral 40-59% ICA stenosis (F/u 1 year)  . Hx of echocardiogram     Echo (11/14):  EF 55-60%, normal LV thickness, Gr 1 DD, mild LAE    Current Outpatient Prescriptions    Medication Sig Dispense Refill  . aspirin 81 MG EC tablet Take 81 mg by mouth daily. Swallow whole.      Marland Kitchen aspirin-acetaminophen-caffeine (EXCEDRIN MIGRAINE) 250-250-65 MG per tablet Take 1 tablet by mouth every 6 (six) hours as needed for pain.      . clomiPHENE (CLOMID) 50 MG tablet Take 50 mg by mouth daily.      Marland Kitchen COENZYME Q-10 PO Take by mouth.      . Melatonin 10 MG TABS Take by mouth at bedtime.      . naproxen sodium (ANAPROX) 220 MG tablet Take 220 mg by mouth as needed.      . S-Adenosylmethionine (SAM-E) 200 MG TABS Take 400 mg by mouth once a week.      . vitamin B-12 (CYANOCOBALAMIN) 50 MCG tablet Take 50 mcg by mouth daily.       No current facility-administered medications for this visit.    Allergies:   Penicillins   Social History:  The patient  reports that he has never smoked. He does not have any smokeless tobacco history on file. He reports that he drinks alcohol. He reports that he does not use illicit drugs.   Family History:  The patient's family history includes Arthritis in his mother; COPD in his mother; Cancer in his maternal grandmother; Fibromyalgia in his mother.   ROS:  Please see the history of present illness.      All other systems reviewed and negative.   PHYSICAL EXAM: VS:  BP 150/80  Pulse 78  Ht 5\' 7"  (1.702 m)  Wt 212 lb (96.163 kg)  BMI 33.20 kg/m2 Well nourished, well developed, in no acute distress HEENT: normal Neck: no JVD Cardiac:  normal S1, S2; RRR; 1/6 systolic murmur at the RUSB Lungs:  clear to auscultation bilaterally, no wheezing, rhonchi or rales Abd: soft, nontender, no hepatomegaly Ext: no edema Skin: warm and dry Neuro:  CNs 2-12 intact, no focal abnormalities noted  EKG:   NSR, HR 78, LAD     ASSESSMENT AND PLAN:  1. Hypertension: I will start him on Lisinopril 10 mg QD.  Check BMET in 1 week.  2. Carotid Stenosis:  Repeat US in 1 year.  Continue ASA and statin. 3. Hyperlipidemia:  We had a long discussion  regarding the rationale for treating cholesterol in the setting of HTN and vascular disease.  He agrees to try Crestor. Start Crestor 10 mg QD.  Check Lipids and LFTs in 6 weeks.   4. Disposition:  F/u with Dr. Tonny Bollman or me in 3 mos.   Signed, Tereso Newcomer, PA-C  06/09/2013 3:54 PM

## 2013-06-17 ENCOUNTER — Other Ambulatory Visit (INDEPENDENT_AMBULATORY_CARE_PROVIDER_SITE_OTHER): Payer: BC Managed Care – PPO

## 2013-06-17 DIAGNOSIS — I10 Essential (primary) hypertension: Secondary | ICD-10-CM

## 2013-06-17 LAB — BASIC METABOLIC PANEL
BUN: 19 mg/dL (ref 6–23)
CO2: 29 mEq/L (ref 19–32)
Calcium: 9.3 mg/dL (ref 8.4–10.5)
Chloride: 111 mEq/L (ref 96–112)
Creatinine, Ser: 1.2 mg/dL (ref 0.4–1.5)
GFR: 67.48 mL/min (ref >60.00)
Glucose, Bld: 102 mg/dL — ABNORMAL HIGH (ref 70–99)
Potassium: 4 mEq/L (ref 3.5–5.1)
Sodium: 147 mEq/L — ABNORMAL HIGH (ref 135–145)

## 2013-06-19 ENCOUNTER — Encounter: Payer: Self-pay | Admitting: Cardiovascular Disease

## 2013-06-19 NOTE — Telephone Encounter (Signed)
This encounter was created in error - please disregard.

## 2013-06-19 NOTE — Telephone Encounter (Signed)
New Message  Please call back to discuss results.

## 2013-08-12 ENCOUNTER — Other Ambulatory Visit: Payer: BC Managed Care – PPO

## 2013-08-19 ENCOUNTER — Encounter (INDEPENDENT_AMBULATORY_CARE_PROVIDER_SITE_OTHER): Payer: Self-pay

## 2013-08-19 ENCOUNTER — Telehealth: Payer: Self-pay | Admitting: *Deleted

## 2013-08-19 ENCOUNTER — Other Ambulatory Visit (INDEPENDENT_AMBULATORY_CARE_PROVIDER_SITE_OTHER): Payer: BC Managed Care – PPO

## 2013-08-19 DIAGNOSIS — I10 Essential (primary) hypertension: Secondary | ICD-10-CM

## 2013-08-19 LAB — LIPID PANEL
Cholesterol: 114 mg/dL (ref 0–200)
HDL: 42.4 mg/dL (ref >39.00)
LDL Cholesterol: 56 mg/dL (ref 0–99)
Total CHOL/HDL Ratio: 3
Triglycerides: 76 mg/dL (ref 0.0–149.0)
VLDL: 15.2 mg/dL (ref 0.0–40.0)

## 2013-08-19 LAB — HEPATIC FUNCTION PANEL
ALT: 34 U/L (ref 0–53)
AST: 38 U/L — ABNORMAL HIGH (ref 0–37)
Albumin: 4 g/dL (ref 3.5–5.2)
Alkaline Phosphatase: 44 U/L (ref 39–117)
Bilirubin, Direct: 0.1 mg/dL (ref 0.0–0.3)
Total Bilirubin: 1.2 mg/dL (ref 0.3–1.2)
Total Protein: 6.9 g/dL (ref 6.0–8.3)

## 2013-08-19 NOTE — Telephone Encounter (Signed)
pt's wife notified about lab results with verbal understanding and was given lipid #'s for the pt.

## 2013-08-31 ENCOUNTER — Other Ambulatory Visit: Payer: Self-pay

## 2013-08-31 DIAGNOSIS — E785 Hyperlipidemia, unspecified: Secondary | ICD-10-CM

## 2013-08-31 DIAGNOSIS — I10 Essential (primary) hypertension: Secondary | ICD-10-CM

## 2013-08-31 MED ORDER — ROSUVASTATIN CALCIUM 10 MG PO TABS: 10.0000 mg | ORAL_TABLET | Freq: Every day | ORAL | Status: DC

## 2013-08-31 MED ORDER — LISINOPRIL 10 MG PO TABS: 10.0000 mg | ORAL_TABLET | Freq: Every day | ORAL | Status: DC

## 2013-09-03 ENCOUNTER — Other Ambulatory Visit: Payer: Self-pay | Admitting: *Deleted

## 2013-09-03 DIAGNOSIS — I10 Essential (primary) hypertension: Secondary | ICD-10-CM

## 2013-09-03 DIAGNOSIS — E785 Hyperlipidemia, unspecified: Secondary | ICD-10-CM

## 2013-09-03 MED ORDER — LISINOPRIL 10 MG PO TABS: 10.0000 mg | ORAL_TABLET | Freq: Every day | ORAL | Status: DC

## 2013-09-03 MED ORDER — ROSUVASTATIN CALCIUM 10 MG PO TABS: 10.0000 mg | ORAL_TABLET | Freq: Every day | ORAL | Status: DC

## 2013-09-09 ENCOUNTER — Ambulatory Visit (INDEPENDENT_AMBULATORY_CARE_PROVIDER_SITE_OTHER): Payer: BC Managed Care – PPO | Admitting: Physician Assistant

## 2013-09-09 ENCOUNTER — Encounter: Payer: Self-pay | Admitting: Physician Assistant

## 2013-09-09 VITALS — BP 130/65 | HR 71 | Ht 67.0 in | Wt 214.8 lb

## 2013-09-09 DIAGNOSIS — I6529 Occlusion and stenosis of unspecified carotid artery: Secondary | ICD-10-CM

## 2013-09-09 DIAGNOSIS — E785 Hyperlipidemia, unspecified: Secondary | ICD-10-CM

## 2013-09-09 DIAGNOSIS — I10 Essential (primary) hypertension: Secondary | ICD-10-CM

## 2013-09-09 NOTE — Patient Instructions (Signed)
Your physician recommends that you continue on your current medications as directed. Please refer to the Current Medication list given to you today.   Your physician has requested that you have a carotid duplex PER SCOTT WEAVER, PAC THIS NEEDS TO BE DONE AFTER 05/13/14; DX CAROTID STENOSIS. This test is an ultrasound of the carotid arteries in your neck. It looks at blood flow through these arteries that supply the brain with blood. Allow one hour for this exam. There are no restrictions or special instructions.  YOU WILL NEED A FOLLOW UP WITH SCOTT WEAVER, PAC 1 WEEK AFTER CAROTID ULTRASOUND IN 05/2014

## 2013-09-09 NOTE — Progress Notes (Signed)
[address] [place], [place]  [address] Phone: [phone] Fax:  [phone]  Date:  09/09/2013   ID:  Alexander Critchleydward D Dillingham, DOB 10/29/1960, MRN [id number]  PCP:  Janace HoardHOPPER,DAVID, MD  Cardiologist:  Dr. Tonny BollmanMichael Cooper     History of Present Illness: Alexander Lamb is a 53 y.o. male with a hx of HTN, carotid stenosis, hyperlipidemia and hypogonadism.  I last saw him in 06/2013.  He is doing well.  The patient denies chest pain, shortness of breath, syncope, orthopnea, PND or significant pedal edema.     Recent Labs: 05/13/2013: Direct LDL 140.0  06/17/2013: Creatinine 1.2; Potassium 4.0  08/19/2013: ALT 34; HDL Cholesterol 42.40; LDL (calc) 56   Wt Readings from Last 3 Encounters:  09/09/13 214 lb 12.8 oz (97.433 kg)  06/09/13 212 lb (96.163 kg)  05/13/13 200 lb (90.719 kg)     Past Medical History  Diagnosis Date  . Allergy   . Arthritis   . Hyperlipidemia   . Carotid stenosis     Carotid US (05/2013): Bilateral 40-59% ICA stenosis (F/u 1 year)  . Hx of echocardiogram     Echo (11/14):  EF 55-60%, normal LV thickness, Gr 1 DD, mild LAE  . Hypertension     Current Outpatient Prescriptions  Medication Sig Dispense Refill  . aspirin-acetaminophen-caffeine (EXCEDRIN MIGRAINE) 250-250-65 MG per tablet Take 1 tablet by mouth every 6 (six) hours as needed for pain.      . clomiPHENE (CLOMID) 50 MG tablet Take 50 mg by mouth daily.      Marland Kitchen. lisinopril (PRINIVIL,ZESTRIL) 10 MG tablet Take 1 tablet (10 mg total) by mouth daily.  90 tablet  1  . Melatonin 10 MG TABS Take by mouth at bedtime.      . rosuvastatin (CRESTOR) 10 MG tablet Take 1 tablet (10 mg total) by mouth daily.  90 tablet  1  . S-Adenosylmethionine (SAM-E) 200 MG TABS Take 400 mg by mouth once a week.      . vitamin B-12 (CYANOCOBALAMIN) 50 MCG tablet Take 50 mcg by mouth daily.       No current facility-administered medications for this visit.    Allergies:   Penicillins   Social History:  The  patient  reports that he has never smoked. He does not have any smokeless tobacco history on file. He reports that he drinks alcohol. He reports that he does not use illicit drugs.   Family History:  The patient's family history includes Arthritis in his mother; COPD in his mother; Cancer in his maternal grandmother; Fibromyalgia in his mother.   ROS:  Please see the history of present illness.    All other systems reviewed and negative.   PHYSICAL EXAM: VS:  BP 130/65  Pulse 71  Ht 5\' 7"  (1.702 m)  Wt 214 lb 12.8 oz (97.433 kg)  BMI 33.63 kg/m2 Well nourished, well developed, in no acute distress HEENT: normal Neck: no JVD Cardiac:  normal S1, S2; RRR; 1/6 systolic murmur at the RUSB Lungs:  clear to auscultation bilaterally, no wheezing, rhonchi or rales Abd: soft, nontender, no hepatomegaly Ext: no edema Skin: warm and dry Neuro:  CNs 2-12 intact, no focal abnormalities noted   ASSESSMENT AND PLAN:  1. Hypertension:  Controlled.  Continue current Rx.  2. Carotid Stenosis:   Repeat US in 05/2014.  Continue ASA and statin. 3. Hyperlipidemia:  Continue statin.  Recent LDL optimal.   4. Disposition:  F/u with me  in 06/2014.   Signed, Tereso Newcomer, PA-C  09/09/2013 3:47 PM

## 2013-10-02 ENCOUNTER — Ambulatory Visit: Payer: BC Managed Care – PPO | Admitting: Internal Medicine

## 2013-10-02 VITALS — BP 110/80 | HR 78 | Temp 98.7°F | Resp 16 | Ht 66.0 in | Wt 213.0 lb

## 2013-10-02 DIAGNOSIS — R05 Cough: Secondary | ICD-10-CM

## 2013-10-02 DIAGNOSIS — R059 Cough, unspecified: Secondary | ICD-10-CM

## 2013-10-02 DIAGNOSIS — J9801 Acute bronchospasm: Secondary | ICD-10-CM

## 2013-10-02 MED ORDER — AZITHROMYCIN 250 MG PO TABS: ORAL_TABLET | ORAL | Status: DC

## 2013-10-02 MED ORDER — HYDROCODONE-HOMATROPINE 5-1.5 MG/5ML PO SYRP: 5.0000 mL | ORAL_SOLUTION | Freq: Four times a day (QID) | ORAL | Status: DC | PRN

## 2013-10-02 MED ORDER — PREDNISONE 20 MG PO TABS: ORAL_TABLET | ORAL | Status: DC

## 2013-10-02 NOTE — Progress Notes (Signed)
   Subjective:    Patient ID: Alexander Lamb, male    DOB: 01/07/1961, 53 y.o.   MRN: [id number] This chart was scribed for Ellamae Siaobert Aeralyn Barna, MD by Nicholos Johnsenise Iheanachor, Medical Scribe. This patient's care was started at 10:34 AM.  Cough Associated symptoms include a sore throat. Pertinent negatives include no chills or fever.  Sore Throat  Associated symptoms include coughing.   HPI Comments: Alexander Lamb is a 53 y.o. male who presents to the [facility] complaining of sore throat and intermittent productive cough, onset 5 days ago. States right eye was stuck together last night due to eye drainage. Says symptoms would get better in the mornings but by mid day would progressively get worse again. Symptoms have interrupted with sleep over the past few nights. Unsure of fever but states if present it would have been low grade. States he has had to miss 2 days of work due to symptoms. Denies chills.  Review of Systems  Constitutional: Negative for fever and chills.  HENT: Positive for sore throat.   Respiratory: Positive for cough.    Objective:   Physical Exam  Vitals reviewed. Constitutional: He is oriented to person, place, and time. He appears well-developed and well-nourished. No distress.  HENT:  Head: Normocephalic and atraumatic.  Right Ear: External ear normal.  Left Ear: External ear normal.  Nose: Nose normal.  Mouth/Throat: Oropharynx is clear and moist.  Eyes: EOM are normal.  conjuctiva injected bilaterally with discharge on lashes.  Neck: Neck supple. No thyromegaly present.  Cardiovascular: Normal rate, regular rhythm and normal heart sounds.   No murmur heard. Pulmonary/Chest: Effort normal. No respiratory distress. He has wheezes.  Wheezing on expiration.  Musculoskeletal: Normal range of motion.  Lymphadenopathy:    He has no cervical adenopathy.  Neurological: He is alert and oriented to person, place, and time.  Skin: Skin is warm and dry.    Psychiatric: He has a normal mood and affect. His behavior is normal.   Assessment & Plan:  I have completed the patient encounter in its entirety as documented by the scribe, with editing by me where necessary. Tameca Jerez P. Merla Richesoolittle, M.D. Acute bronchospasm=RAD 2 to LRI Cough 2ndary  Meds ordered this encounter  Medications  . HYDROcodone-homatropine (HYCODAN) 5-1.5 MG/5ML syrup    Sig: Take 5 mLs by mouth every 6 (six) hours as needed for cough.    Dispense:  120 mL    Refill:  0  . azithromycin (ZITHROMAX) 250 MG tablet    Sig: As packaged    Dispense:  6 tablet    Refill:  0  . predniSONE (DELTASONE) 20 MG tablet    Sig: 3/3/2/2/1/1 single daily dose for 6 days    Dispense:  12 tablet    Refill:  0   Sudafed 12h

## 2013-11-24 ENCOUNTER — Ambulatory Visit: Payer: BC Managed Care – PPO | Admitting: Family Medicine

## 2013-11-24 VITALS — BP 116/70 | HR 74 | Temp 97.9°F | Resp 16 | Ht 67.0 in | Wt 221.4 lb

## 2013-11-24 DIAGNOSIS — R0981 Nasal congestion: Secondary | ICD-10-CM

## 2013-11-24 DIAGNOSIS — J301 Allergic rhinitis due to pollen: Secondary | ICD-10-CM

## 2013-11-24 DIAGNOSIS — J3489 Other specified disorders of nose and nasal sinuses: Secondary | ICD-10-CM

## 2013-11-24 DIAGNOSIS — J309 Allergic rhinitis, unspecified: Secondary | ICD-10-CM

## 2013-11-24 MED ORDER — METHYLPREDNISOLONE ACETATE 80 MG/ML IJ SUSP
120.0000 mg | Freq: Once | INTRAMUSCULAR | Status: AC
  Administered 2013-11-24: 120 mg via INTRAMUSCULAR

## 2013-11-24 MED ORDER — HYDROCOD POLST-CHLORPHEN POLST 10-8 MG/5ML PO LQCR: 5.0000 mL | Freq: Two times a day (BID) | ORAL | Status: DC | PRN

## 2013-11-24 MED ORDER — METHYLPREDNISOLONE ACETATE 80 MG/ML IJ SUSP: 80.0000 mg | Freq: Once | INTRAMUSCULAR | Status: DC

## 2013-11-24 MED ORDER — CETIRIZINE HCL 10 MG PO TABS: 10.0000 mg | ORAL_TABLET | Freq: Every day | ORAL | Status: DC

## 2013-11-24 MED ORDER — SULFAMETHOXAZOLE-TMP DS 800-160 MG PO TABS: 1.0000 | ORAL_TABLET | Freq: Two times a day (BID) | ORAL | Status: DC

## 2013-11-24 MED ORDER — IPRATROPIUM BROMIDE 0.03 % NA SOLN: 2.0000 | Freq: Four times a day (QID) | NASAL | Status: DC

## 2013-11-24 NOTE — Patient Instructions (Signed)
Hay Fever Hay fever is an allergic reaction to particles in the air. It cannot be passed from person to person. It cannot be cured, but it can be controlled. CAUSES  Hay fever is caused by something that triggers an allergic reaction (allergens). The following are examples of allergens:  Ragweed.  Feathers.  Animal dander.  Grass and tree pollens.  Cigarette smoke.  House dust.  Pollution. SYMPTOMS   Sneezing.  Runny or stuffy nose.  Tearing eyes.  Itchy eyes, nose, mouth, throat, skin, or other area.  Sore throat.  Headache.  Decreased sense of smell or taste. DIAGNOSIS Your caregiver will perform a physical exam and ask questions about the symptoms you are having.Allergy testing may be done to determine exactly what triggers your hay fever.  TREATMENT   Over-the-counter medicines may help symptoms. These include:  Antihistamines.  Decongestants. These may help with nasal congestion.  Your caregiver may prescribe medicines if over-the-counter medicines do not work.  Some people benefit from allergy shots when other medicines are not helpful. HOME CARE INSTRUCTIONS   Avoid the allergen that is causing your symptoms, if possible.  Take all medicine as told by your caregiver. SEEK MEDICAL CARE IF:   You have severe allergy symptoms and your current medicines are not helping.  Your treatment was working at one time, but you are now experiencing symptoms.  You have sinus congestion and pressure.  You develop a fever or headache.  You have thick nasal discharge.  You have asthma and have a worsening cough and wheezing. SEEK IMMEDIATE MEDICAL CARE IF:   You have swelling of your tongue or lips.  You have trouble breathing.  You feel lightheaded or like you are going to faint.  You have cold sweats.  You have a fever. Document Released: 06/18/2005 Document Revised: 09/10/2011 Document Reviewed: 09/13/2010 ExitCare Patient Information 2014  [employer].  

## 2013-11-24 NOTE — Progress Notes (Signed)
Subjective:    Patient ID: Alexander Lamb, male    DOB: 05/07/1961, 53 y.o.   MRN: [id number] Chief Complaint  Patient presents with  . URI    HA;sinus press;ST;cough-yellow;chest congestion x yesterday    HPI See in OV 6 wks prev and did get better after about 3 wks. Sev d prev began to develop sig fatigue with progressive pharyngitis. Has been unable to go to work.  Cough is dry though finally got up some yellow-green phlegm this morning.  Tried mucinex w/o relief but no other otc meds.  Did try some of the cough syrup but only helped for about 2-3 hrs.  No f/c. No HA now but yesterday had severe sinus pressure behind left eye. No ear sxs other than occ tinnitus in Lt ear but self-limited.  Is having some blood in mucous out of left nare occasionally.  Sleeping ok. Has not tried netti pot or sinus rinse - does not like to use these. Has not tried humidifier. Wife has also recently been ill.  Past Medical History  Diagnosis Date  . Allergy   . Arthritis   . Hyperlipidemia   . Carotid stenosis     Carotid US (05/2013): Bilateral 40-59% ICA stenosis (F/u 1 year)  . Hx of echocardiogram     Echo (11/14):  EF 55-60%, normal LV thickness, Gr 1 DD, mild LAE  . Hypertension    Current Outpatient Prescriptions on File Prior to Visit  Medication Sig Dispense Refill  . Ascorbic Acid (VITAMIN C PO) Take by mouth daily.      Marland Kitchen. aspirin-acetaminophen-caffeine (EXCEDRIN MIGRAINE) 250-250-65 MG per tablet Take 1 tablet by mouth every 6 (six) hours as needed for pain.      . clomiPHENE (CLOMID) 50 MG tablet Take 50 mg by mouth daily.      Marland Kitchen. ibuprofen (ADVIL,MOTRIN) 200 MG tablet Take 200 mg by mouth every 6 (six) hours as needed.      Marland Kitchen. lisinopril (PRINIVIL,ZESTRIL) 10 MG tablet Take 1 tablet (10 mg total) by mouth daily.  90 tablet  1  . Melatonin 10 MG TABS Take by mouth at bedtime.      . rosuvastatin (CRESTOR) 10 MG tablet Take 1 tablet (10 mg total) by mouth daily.  90 tablet  1  .  S-Adenosylmethionine (SAM-E) 200 MG TABS Take 400 mg by mouth once a week.       No current facility-administered medications on file prior to visit.   Allergies  Allergen Reactions  . Penicillins Rash    Review of Systems  Constitutional: Positive for activity change and fatigue. Negative for fever, appetite change and unexpected weight change.  HENT: Positive for congestion, postnasal drip, rhinorrhea and sinus pressure. Negative for ear pain, mouth sores and sore throat.   Respiratory: Positive for cough. Negative for shortness of breath.   Cardiovascular: Negative for chest pain.  Gastrointestinal: Negative for nausea, vomiting, abdominal pain, diarrhea and constipation.  Genitourinary: Negative for dysuria.  Musculoskeletal: Negative for arthralgias, myalgias, neck pain and neck stiffness.  Skin: Negative for rash.  Neurological: Positive for headaches. Negative for syncope.  Hematological: Positive for adenopathy.  Psychiatric/Behavioral: Negative for sleep disturbance.      BP 116/70  Pulse 74  Temp(Src) 97.9 F (36.6 C) (Oral)  Resp 16  Ht 5\' 7"  (1.702 m)  Wt 221 lb 6.4 oz (100.426 kg)  BMI 34.67 kg/m2  SpO2 98% Objective:   Physical Exam  Constitutional: He is oriented to  person, place, and time. He appears well-developed and well-nourished. No distress.  HENT:  Head: Normocephalic and atraumatic.  Right Ear: External ear and ear canal normal. Tympanic membrane is injected and retracted.  Left Ear: External ear and ear canal normal. Tympanic membrane is injected and retracted.  Nose: Mucosal edema and rhinorrhea present. Right sinus exhibits no maxillary sinus tenderness. Left sinus exhibits no maxillary sinus tenderness.  Mouth/Throat: Uvula is midline and mucous membranes are normal. Posterior oropharyngeal erythema present. No oropharyngeal exudate or posterior oropharyngeal edema.  Eyes: Conjunctivae are normal. Right eye exhibits no discharge. Left eye exhibits  no discharge. No scleral icterus.  Neck: Normal range of motion. Neck supple. No thyromegaly present.  Cardiovascular: Normal rate, regular rhythm, normal heart sounds and intact distal pulses.   Pulmonary/Chest: Effort normal and breath sounds normal. No respiratory distress.  Lymphadenopathy:       Head (right side): No submandibular adenopathy present.       Head (left side): No submandibular adenopathy present.    He has no cervical adenopathy.       Right: No supraclavicular adenopathy present.       Left: No supraclavicular adenopathy present.  Neurological: He is alert and oriented to person, place, and time.  Skin: Skin is warm and dry. He is not diaphoretic. No erythema.  Psychiatric: He has a normal mood and affect. His behavior is normal.          Assessment & Plan:   Hay fever - Plan: methylPREDNISolone acetate (DEPO-MEDROL) injection 120 mg, DISCONTINUED: methylPREDNISolone acetate (DEPO-MEDROL) injection 80 mg  Nasal sinus congestion - Plan: methylPREDNISolone acetate (DEPO-MEDROL) injection 120 mg, DISCONTINUED: methylPREDNISolone acetate (DEPO-MEDROL) injection 80 mg Will give paper rx for snap abx for pt to fill if sxs cont to worsen or localize over the next sev d. Allergic to pcn and on zpack less than 6 wks prior so will try bactrim if worsens. Meds ordered this encounter  Medications  . Omega-3 Fatty Acids (FISH OIL) 1000 MG CAPS    Sig: Take by mouth.  . chlorpheniramine-HYDROcodone (TUSSIONEX PENNKINETIC ER) 10-8 MG/5ML LQCR    Sig: Take 5 mLs by mouth every 12 (twelve) hours as needed.    Dispense:  120 mL    Refill:  0  . ipratropium (ATROVENT) 0.03 % nasal spray    Sig: Place 2 sprays into the nose 4 (four) times daily.    Dispense:  30 mL    Refill:  1  . cetirizine (ZYRTEC) 10 MG tablet    Sig: Take 1 tablet (10 mg total) by mouth daily.    Dispense:  30 tablet    Refill:  1  . DISCONTD: methylPREDNISolone acetate (DEPO-MEDROL) injection 80 mg     Sig:   . methylPREDNISolone acetate (DEPO-MEDROL) injection 120 mg    Sig:     Norberto Sorenson, MD MPH

## 2013-12-02 ENCOUNTER — Ambulatory Visit: Payer: BC Managed Care – PPO | Admitting: Emergency Medicine

## 2013-12-02 ENCOUNTER — Ambulatory Visit: Payer: BC Managed Care – PPO

## 2013-12-02 ENCOUNTER — Telehealth: Payer: Self-pay

## 2013-12-02 VITALS — BP 128/85 | HR 66 | Temp 99.5°F | Resp 18 | Ht 66.0 in | Wt 214.2 lb

## 2013-12-02 DIAGNOSIS — R05 Cough: Secondary | ICD-10-CM

## 2013-12-02 DIAGNOSIS — M542 Cervicalgia: Secondary | ICD-10-CM

## 2013-12-02 DIAGNOSIS — R110 Nausea: Secondary | ICD-10-CM

## 2013-12-02 DIAGNOSIS — R509 Fever, unspecified: Secondary | ICD-10-CM

## 2013-12-02 DIAGNOSIS — R059 Cough, unspecified: Secondary | ICD-10-CM

## 2013-12-02 LAB — POCT URINALYSIS DIPSTICK
Bilirubin, UA: NEGATIVE
Glucose, UA: NEGATIVE
Ketones, UA: NEGATIVE
Leukocytes, UA: NEGATIVE
Nitrite, UA: NEGATIVE
Spec Grav, UA: 1.015
Urobilinogen, UA: 0.2
pH, UA: 6

## 2013-12-02 LAB — COMPLETE METABOLIC PANEL WITH GFR
ALT: 39 U/L (ref 0–53)
AST: 35 U/L (ref 0–37)
Albumin: 4.4 g/dL (ref 3.5–5.2)
Alkaline Phosphatase: 50 U/L (ref 39–117)
BUN: 16 mg/dL (ref 6–23)
CO2: 25 mEq/L (ref 19–32)
Calcium: 9.1 mg/dL (ref 8.4–10.5)
Chloride: 102 mEq/L (ref 96–112)
Creat: 1.43 mg/dL — ABNORMAL HIGH (ref 0.50–1.35)
GFR, Est African American: 65 mL/min
GFR, Est Non African American: 56 mL/min — ABNORMAL LOW
Glucose, Bld: 113 mg/dL — ABNORMAL HIGH (ref 70–99)
Potassium: 4.9 mEq/L (ref 3.5–5.3)
Sodium: 137 mEq/L (ref 135–145)
Total Bilirubin: 1 mg/dL (ref 0.2–1.2)
Total Protein: 7.3 g/dL (ref 6.0–8.3)

## 2013-12-02 LAB — POCT CBC
Granulocyte percent: 87.5 %G — AB (ref 37–80)
HCT, POC: 46.8 % (ref 43.5–53.7)
Hemoglobin: 15 g/dL (ref 14.1–18.1)
Lymph, poc: 0.6 (ref 0.6–3.4)
MCH, POC: 29.4 pg (ref 27–31.2)
MCHC: 32.1 g/dL (ref 31.8–35.4)
MCV: 91.7 fL (ref 80–97)
MID (cbc): 0.3 (ref 0–0.9)
MPV: 8.7 fL (ref 0–99.8)
POC Granulocyte: 6.1 (ref 2–6.9)
POC LYMPH PERCENT: 8.5 %L — AB (ref 10–50)
POC MID %: 4 %M (ref 0–12)
Platelet Count, POC: 141 10*3/uL — AB (ref 142–424)
RBC: 5.1 M/uL (ref 4.69–6.13)
RDW, POC: 13.5 %
WBC: 7 10*3/uL (ref 4.6–10.2)

## 2013-12-02 LAB — POCT UA - MICROSCOPIC ONLY
Casts, Ur, LPF, POC: NEGATIVE
Crystals, Ur, HPF, POC: NEGATIVE
WBC, Ur, HPF, POC: NEGATIVE
Yeast, UA: NEGATIVE

## 2013-12-02 LAB — POCT INFLUENZA A/B
Influenza A, POC: NEGATIVE
Influenza B, POC: NEGATIVE

## 2013-12-02 LAB — POCT SEDIMENTATION RATE: POCT SED RATE: 36 mm/hr — AB (ref 0–22)

## 2013-12-02 NOTE — Telephone Encounter (Signed)
Pt called to let Dr.Daub know that the last time he was at the dentist was April 7th of this year

## 2013-12-02 NOTE — Patient Instructions (Signed)
Fever, Adult A fever is a higher than normal body temperature. In an adult, an oral temperature around 98.6 F (37 C) is considered normal. A temperature of 100.4 F (38 C) or higher is generally considered a fever. Mild or moderate fevers generally have no long-term effects and often do not require treatment. Extreme fever (greater than or equal to 106 F or 41.1 C) can cause seizures. The sweating that may occur with repeated or prolonged fever may cause dehydration. Elderly people can develop confusion during a fever. A measured temperature can vary with:  Age.  Time of day.  Method of measurement (mouth, underarm, rectal, or ear). The fever is confirmed by taking a temperature with a thermometer. Temperatures can be taken different ways. Some methods are accurate and some are not.  An oral temperature is used most commonly. Electronic thermometers are fast and accurate.  An ear temperature will only be accurate if the thermometer is positioned as recommended by the manufacturer.  A rectal temperature is accurate and done for those adults who have a condition where an oral temperature cannot be taken.  An underarm (axillary) temperature is not accurate and not recommended. Fever is a symptom, not a disease.  CAUSES   Infections commonly cause fever.  Some noninfectious causes for fever include:  Some arthritis conditions.  Some thyroid or adrenal gland conditions.  Some immune system conditions.  Some types of cancer.  A medicine reaction.  High doses of certain street drugs such as methamphetamine.  Dehydration.  Exposure to high outside or room temperatures.  Occasionally, the source of a fever cannot be determined. This is sometimes called a "fever of unknown origin" (FUO).  Some situations may lead to a temporary rise in body temperature that may go away on its own. Examples are:  Childbirth.  Surgery.  Intense exercise. HOME CARE INSTRUCTIONS   Take  appropriate medicines for fever. Follow dosing instructions carefully. If you use acetaminophen to reduce the fever, be careful to avoid taking other medicines that also contain acetaminophen. Do not take aspirin for a fever if you are younger than age 19. There is an association with Reye's syndrome. Reye's syndrome is a rare but potentially deadly disease.  If an infection is present and antibiotics have been prescribed, take them as directed. Finish them even if you start to feel better.  Rest as needed.  Maintain an adequate fluid intake. To prevent dehydration during an illness with prolonged or recurrent fever, you may need to drink extra fluid.Drink enough fluids to keep your urine clear or pale yellow.  Sponging or bathing with room temperature water may help reduce body temperature. Do not use ice water or alcohol sponge baths.  Dress comfortably, but do not over-bundle. SEEK MEDICAL CARE IF:   You are unable to keep fluids down.  You develop vomiting or diarrhea.  You are not feeling at least partly better after 3 days.  You develop new symptoms or problems. SEEK IMMEDIATE MEDICAL CARE IF:   You have shortness of breath or trouble breathing.  You develop excessive weakness.  You are dizzy or you faint.  You are extremely thirsty or you are making little or no urine.  You develop new pain that was not there before (such as in the head, neck, chest, back, or abdomen).  You have persistant vomiting and diarrhea for more than 1 to 2 days.  You develop a stiff neck or your eyes become sensitive to light.  You develop a   skin rash.  You have a fever or persistent symptoms for more than 2 to 3 days.  You have a fever and your symptoms suddenly get worse. MAKE SURE YOU:   Understand these instructions.  Will watch your condition.  Will get help right away if you are not doing well or get worse. Document Released: 12/12/2000 Document Revised: 09/10/2011 Document  Reviewed: 04/19/2011 [employer] Patient Information 2014 [employer].  

## 2013-12-02 NOTE — Progress Notes (Addendum)
Subjective:  This chart was scribed for Alexander Gobble, MD by Bronson Curb, ED Scribe. This patient was seen in room Room/bed 11 and the patient's care was started at 10:17 AM.     Patient ID: Alexander Lamb, male    DOB: 1960-08-13, 53 y.o.   MRN: [id number]  HPI  HPI Comments: Alexander Lamb is a 53 y.o. male who presents to the [facility] complaining of gradually worsening fever onset 2 days ago. Patient states he was seen here 8 days ago because he was "under the weather". He reports he was diagnosed with a sinus infection and prescribed antibiotics and nasal spray. He states his symptoms improved until 2 days ago. There is associated fever (T Max 102.6 at home, 2 days ago), myalgias, neck pain, mild HA, upper and lumbar back pain, abdominal pain, mild nausea. He states He denies rash or tick bite, recent travel, sick contacts. 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 Patient suspects his symptoms are the result of a reaction to Bactrim. He reports he had never taken the medication prior to his visit last week.He reports history of arthritis. He denies emesis, sore throat, respiratory, and urinary symptoms.   Review of Systems  Constitutional: Positive for fever.  HENT: Negative for sore throat.   Respiratory: Negative for cough and shortness of breath.   Gastrointestinal: Positive for nausea and abdominal pain. Negative for vomiting.  Genitourinary: Negative for dysuria.  Musculoskeletal: Positive for back pain, myalgias and neck pain.  Neurological: Positive for headaches.       Objective:   Physical Exam  CONSTITUTIONAL: Well developed/well nourished HEAD: Normocephalic/atraumatic EYES: EOMI/PERRL ENMT: Mucous membranes moist NECK: supple no meningeal signs SPINE:entire spine nontender CV: S1/S2 noted, no murmurs/rubs/gallops noted LUNGS: A few dry rales occasionally ABDOMEN: soft, nontender, no  rebound or guarding GU:no cva tenderness NEURO: Pt is awake/alert, moves all extremitiesx4. Does not have nuchal rigidity. EXTREMITIES: pulses normal, full ROM SKIN: somewhat clammy PSYCH: no abnormalities of mood noted Results for orders placed in visit on 12/02/13  POCT CBC      Result Value Ref Range   WBC 7.0  4.6 - 10.2 K/uL   Lymph, poc 0.6  0.6 - 3.4   POC LYMPH PERCENT 8.5 (*) 10 - 50 %L   MID (cbc) 0.3  0 - 0.9   POC MID % 4.0  0 - 12 %M   POC Granulocyte 6.1  2 - 6.9   Granulocyte percent 87.5 (*) 37 - 80 %G   RBC 5.10  4.69 - 6.13 M/uL   Hemoglobin 15.0  14.1 - 18.1 g/dL   HCT, POC 29.0  21.1 - 53.7 %   MCV 91.7  80 - 97 fL   MCH, POC 29.4  27 - 31.2 pg   MCHC 32.1  31.8 - 35.4 g/dL   RDW, POC 15.5     Platelet Count, POC 141 (*) 142 - 424 K/uL   MPV 8.7  0 - 99.8 fL  POCT URINALYSIS DIPSTICK      Result Value Ref Range   Color, UA yellow     Clarity, UA clear     Glucose, UA neg     Bilirubin, UA neg     Ketones, UA neg     Spec Grav, UA 1.015     Blood, UA small     pH, UA 6.0  Protein, UA trace     Urobilinogen, UA 0.2     Nitrite, UA neg     Leukocytes, UA Negative    POCT UA - MICROSCOPIC ONLY      Result Value Ref Range   WBC, Ur, HPF, POC neg     RBC, urine, microscopic 2-5     Bacteria, U Microscopic trace     Mucus, UA trace     Epithelial cells, urine per micros 0-2     Crystals, Ur, HPF, POC neg     Casts, Ur, LPF, POC neg     Yeast, UA neg    [facility] reading (PRIMARY) by  Dr. Cleta Alberts there is a linear area in the left lower lobe seen best on lateral consistent with a linear infiltrate  Flu test neg Results for orders placed in visit on 12/02/13  COMPLETE METABOLIC PANEL WITH GFR      Result Value Ref Range   Sodium 137  135 - 145 mEq/L   Potassium 4.9  3.5 - 5.3 mEq/L   Chloride 102  96 - 112 mEq/L   CO2 25  19 - 32 mEq/L   Glucose, Bld 113 (*) 70 - 99 mg/dL   BUN 16  6 - 23 mg/dL   Creat 9.56 (*) 2.13 - 1.35 mg/dL   Total Bilirubin  1.0  0.2 - 1.2 mg/dL   Alkaline Phosphatase 50  39 - 117 U/L   AST 35  0 - 37 U/L   ALT 39  0 - 53 U/L   Total Protein 7.3  6.0 - 8.3 g/dL   Albumin 4.4  3.5 - 5.2 g/dL   Calcium 9.1  8.4 - 08.6 mg/dL   GFR, Est African American 65     GFR, Est Non African American 56 (*)   POCT CBC      Result Value Ref Range   WBC 7.0  4.6 - 10.2 K/uL   Lymph, poc 0.6  0.6 - 3.4   POC LYMPH PERCENT 8.5 (*) 10 - 50 %L   MID (cbc) 0.3  0 - 0.9   POC MID % 4.0  0 - 12 %M   POC Granulocyte 6.1  2 - 6.9   Granulocyte percent 87.5 (*) 37 - 80 %G   RBC 5.10  4.69 - 6.13 M/uL   Hemoglobin 15.0  14.1 - 18.1 g/dL   HCT, POC 57.8  46.9 - 53.7 %   MCV 91.7  80 - 97 fL   MCH, POC 29.4  27 - 31.2 pg   MCHC 32.1  31.8 - 35.4 g/dL   RDW, POC 62.9     Platelet Count, POC 141 (*) 142 - 424 K/uL   MPV 8.7  0 - 99.8 fL  POCT URINALYSIS DIPSTICK      Result Value Ref Range   Color, UA yellow     Clarity, UA clear     Glucose, UA neg     Bilirubin, UA neg     Ketones, UA neg     Spec Grav, UA 1.015     Blood, UA small     pH, UA 6.0     Protein, UA trace     Urobilinogen, UA 0.2     Nitrite, UA neg     Leukocytes, UA Negative    POCT UA - MICROSCOPIC ONLY      Result Value Ref Range   WBC, Ur, HPF, POC neg     RBC,  urine, microscopic 2-5     Bacteria, U Microscopic trace     Mucus, UA trace     Epithelial cells, urine per micros 0-2     Crystals, Ur, HPF, POC neg     Casts, Ur, LPF, POC neg     Yeast, UA neg    POCT SEDIMENTATION RATE      Result Value Ref Range   POCT SED RATE 36 (*) 0 - 22 mm/hr  POCT INFLUENZA A/B      Result Value Ref Range   Influenza A, POC Negative     Influenza B, POC Negative        Assessment & Plan:  White blood cell count is normal. There is a small amount of blood in his urine his platelets are borderline low. I'm concerned this could be drug related and related to the Septra. At the present time he will be on fluids, rest. And  Tylenol. We'll have them do a stat  Cmet. Patient was advised that sometimes hospitalization is necessary with this disorder. He was alerted to watch for increasing nuchal rigidity there was a mild decrease in kidney function but labs otherwise unremarkable [facility]  I personally performed the services described in this documentation, which was scribed in my presence. The recorded information has been reviewed and is accurate.

## 2013-12-02 NOTE — Telephone Encounter (Signed)
Pt called back and didn't have any other questions of reports for Dr Cleta Alberts. He did ask for the actual values were on his glucose and creatinine results. D/W him and he asked if taking a creatinine supplement could raise his creatinine level? He reports that he takes 1-3 OTC tablets daily.

## 2013-12-03 LAB — ROCKY MTN SPOTTED FVR AB, IGM-BLOOD: ROCKY MTN SPOTTED FEVER, IGM: 0.13 IV

## 2013-12-04 ENCOUNTER — Ambulatory Visit: Payer: BC Managed Care – PPO | Admitting: Emergency Medicine

## 2013-12-04 VITALS — BP 124/76 | HR 75 | Temp 98.1°F | Resp 16 | Ht 66.0 in | Wt 214.0 lb

## 2013-12-04 DIAGNOSIS — R509 Fever, unspecified: Secondary | ICD-10-CM

## 2013-12-04 DIAGNOSIS — T370X5A Adverse effect of sulfonamides, initial encounter: Secondary | ICD-10-CM

## 2013-12-04 NOTE — Progress Notes (Signed)
   Subjective:    Patient ID: Alexander Lamb, male    DOB: 1960/11/18, 53 y.o.   MRN: [id number]  HPI patient states he is feeling much better. His fever has resolved as of yesterday. He awakened this morning did not have neck discomfort or stiffness. He is eating well. He has had no nausea or vomiting. Overall he feels significantly better .    Review of Systems  Constitutional: Negative.   HENT: Negative.   Eyes: Negative.   Respiratory:       He initially was having some difficulty with breathing this has resolved .  Cardiovascular: Negative.   Gastrointestinal:       His nausea and abdominal discomfort have resolved  Endocrine: Negative.   Genitourinary: Negative.   Skin: Negative.   Neurological: Negative.   Psychiatric/Behavioral: Negative.        Objective:   Physical Exam patient is alert and cooperative. Neck is supple. His chest was clear. Heart regular rate no murmurs. Abdomen is soft there are no areas of tenderness there is a nonbillable hernia present. His extremities are without edema.        Assessment & Plan:  Repeat pressure was normal. He overall significantly better from his symptoms. Blood cultures negative to date. Comprehensive metabolic panel was also unremarkable. Will consider this a probable drug reaction to sulfa. As long as he feels well he can return to work Monday. He is instructed to return to clinic if his symptoms return to

## 2013-12-08 LAB — CULTURE, BLOOD, SINGLE SET ONLY: Organism ID, Bacteria: NO GROWTH

## 2014-02-18 ENCOUNTER — Other Ambulatory Visit: Payer: Self-pay | Admitting: Cardiovascular Disease

## 2014-03-11 ENCOUNTER — Other Ambulatory Visit: Payer: Self-pay | Admitting: Cardiovascular Disease

## 2014-05-10 ENCOUNTER — Ambulatory Visit (HOSPITAL_COMMUNITY): Payer: BC Managed Care – PPO | Attending: Internal Medicine | Admitting: Cardiology

## 2014-05-10 DIAGNOSIS — I10 Essential (primary) hypertension: Secondary | ICD-10-CM | POA: Diagnosis not present

## 2014-05-10 DIAGNOSIS — E785 Hyperlipidemia, unspecified: Secondary | ICD-10-CM | POA: Diagnosis not present

## 2014-05-10 DIAGNOSIS — R0989 Other specified symptoms and signs involving the circulatory and respiratory systems: Secondary | ICD-10-CM | POA: Diagnosis not present

## 2014-05-10 DIAGNOSIS — I6529 Occlusion and stenosis of unspecified carotid artery: Secondary | ICD-10-CM

## 2014-05-10 NOTE — Progress Notes (Signed)
Carotid duplex performed 

## 2014-05-12 ENCOUNTER — Encounter: Payer: Self-pay | Admitting: Physician Assistant

## 2014-05-19 ENCOUNTER — Other Ambulatory Visit (HOSPITAL_COMMUNITY): Payer: Self-pay | Admitting: Cardiovascular Disease

## 2014-05-20 ENCOUNTER — Other Ambulatory Visit (HOSPITAL_COMMUNITY): Payer: Self-pay | Admitting: Cardiovascular Disease

## 2014-08-20 ENCOUNTER — Other Ambulatory Visit (HOSPITAL_COMMUNITY): Payer: Self-pay | Admitting: Cardiovascular Disease

## 2014-08-24 ENCOUNTER — Telehealth: Payer: Self-pay | Admitting: Cardiovascular Disease

## 2014-08-24 NOTE — Telephone Encounter (Signed)
New message      Crestor 10mg  needs prior approval.  Ref # [id number]

## 2014-08-25 NOTE — Telephone Encounter (Signed)
I spoke with the pharmacist at [facility] and made them aware that the pt has taken Simvastatin in the past. Crestor has been approved through 08/25/15.

## 2014-08-25 NOTE — Telephone Encounter (Signed)
F/U       [facility] calling, would like for pt to be considered for Symbastatin or will need prior auth for Crestor.   Please call at  [phone] between 9am- 5:30pm.

## 2014-11-03 ENCOUNTER — Other Ambulatory Visit (HOSPITAL_COMMUNITY): Payer: Self-pay | Admitting: Cardiovascular Disease

## 2014-11-16 ENCOUNTER — Other Ambulatory Visit (HOSPITAL_COMMUNITY): Payer: Self-pay | Admitting: Cardiovascular Disease

## 2015-02-03 ENCOUNTER — Other Ambulatory Visit (HOSPITAL_COMMUNITY): Payer: Self-pay | Admitting: Cardiovascular Disease

## 2015-02-07 ENCOUNTER — Other Ambulatory Visit: Payer: Self-pay

## 2015-02-07 MED ORDER — LISINOPRIL 10 MG PO TABS: 10.0000 mg | ORAL_TABLET | Freq: Every day | ORAL | Status: DC

## 2015-02-15 ENCOUNTER — Other Ambulatory Visit (HOSPITAL_COMMUNITY): Payer: Self-pay | Admitting: Cardiovascular Disease

## 2015-03-09 NOTE — Progress Notes (Signed)
Cardiology Office Note   Date:  03/10/2015   ID:  Alexander Lamb, DOB 1961/05/21, MRN [id number]  PCP:  Ruben Reason, MD  Cardiologist:  Dr. Sherren Mocha   Electrophysiologist:  n/a  Chief Complaint  Patient presents with  . Follow-up    HTN     History of Present Illness: Alexander Lamb is a 54 y.o. male with a hx of HTN, carotid stenosis, hyperlipidemia and hypogonadism.  Last seen 3/15.  Prior Carotid US had demonstrated 40-59% ICA stenosis.  However, FU duplex in 11/15 demonstrated no ICA stenosis.    Returns for FU. Doing well.  He continues to exercise regularly. He lifts weights and jogs. He denies chest pain, shortness breath, syncope, near syncope, orthopnea, PND or edema. He does note recent history of dizziness and headaches. Dizziness is described as spinning and usually occurs when he moves his head in certain direction.    Studies/Reports Reviewed Today:  Carotid US 11/15 No ICA stenosis   Past Medical History  Diagnosis Date  . Allergy   . Arthritis   . Hyperlipidemia   . Carotid stenosis     a.  Carotid US (05/2013): Bilateral 40-59% ICA stenosis (F/u 1 year);  b.  Carotid US (11/15):  No ICA stenosis  . Hx of echocardiogram     Echo (11/14):  EF 55-60%, normal LV thickness, Gr 1 DD, mild LAE  . Hypertension     No past surgical history on file.   Current Outpatient Prescriptions  Medication Sig Dispense Refill  . aspirin-acetaminophen-caffeine (EXCEDRIN MIGRAINE) 250-250-65 MG per tablet Take 1 tablet by mouth every 6 (six) hours as needed for pain.    . clomiPHENE (CLOMID) 50 MG tablet Take 50 mg by mouth daily.    Marland Kitchen ibuprofen (ADVIL,MOTRIN) 200 MG tablet Take 200 mg by mouth every 6 (six) hours as needed.    Marland Kitchen lisinopril (PRINIVIL,ZESTRIL) 10 MG tablet Take 1 tablet (10 mg total) by mouth daily. 30 tablet 11  . Melatonin 10 MG TABS Take by mouth at bedtime.    . rosuvastatin (CRESTOR) 10 MG tablet Take 1 tablet (10 mg total) by mouth daily.  90 tablet 3  . S-Adenosylmethionine (SAM-E) 200 MG TABS Take 400 mg by mouth once a week.    . CIALIS 5 MG tablet Take 5 mg by mouth daily as needed.   5  . meclizine (ANTIVERT) 25 MG tablet Take 0.5 tablets (12.5 mg total) by mouth 2 (two) times daily as needed for dizziness. 30 tablet 1   No current facility-administered medications for this visit.    Allergies:   Sulfa antibiotics and Penicillins    Social History:  The patient  reports that he has never smoked. He does not have any smokeless tobacco history on file. He reports that he drinks alcohol. He reports that he does not use illicit drugs.   Family History:  The patient's family history includes Arthritis in his mother; COPD in his mother; Cancer in his maternal grandmother; Fibromyalgia in his mother.    ROS:   Please see the history of present illness.   Review of Systems  HENT: Positive for headaches.   Respiratory: Positive for snoring.   Musculoskeletal: Positive for back pain.  Genitourinary: Positive for incomplete emptying.  Neurological: Positive for dizziness.  All other systems reviewed and are negative.     PHYSICAL EXAM: VS:  BP 130/70 mmHg  Pulse 68  Ht '5\' 7"'  (1.702 m)  Wt 226 lb  6.4 oz (102.694 kg)  BMI 35.45 kg/m2    Wt Readings from Last 3 Encounters:  03/10/15 226 lb 6.4 oz (102.694 kg)  12/04/13 214 lb (97.07 kg)  12/02/13 214 lb 3.2 oz (97.16 kg)     GEN: Well nourished, well developed, in no acute distress HEENT: normal Neck: no JVD,  no masses Cardiac:  Normal S1/S2, RRR; no murmur ,  no rubs or gallops, no edema   Respiratory:  clear to auscultation bilaterally, no wheezing, rhonchi or rales. GI: soft, nontender, nondistended, + BS MS: no deformity or atrophy Skin: warm and dry  Neuro:  CNs II-XII intact, Strength and sensation are intact Psych: Normal affect   EKG:  EKG is ordered today.  It demonstrates:   NSR, HR 68, no change from prior tracing   Recent Labs: No results  found for requested labs within last 365 days.    Lipid Panel    Component Value Date/Time   CHOL 114 08/19/2013 1022   TRIG 76.0 08/19/2013 1022   HDL 42.40 08/19/2013 1022   CHOLHDL 3 08/19/2013 1022   VLDL 15.2 08/19/2013 1022   LDLCALC 56 08/19/2013 1022   LDLDIRECT 140.0 05/13/2013 1306      ASSESSMENT AND PLAN:  Essential hypertension:  Controlled. Continue current therapy. Check CMET today  HLD (hyperlipidemia) -  continue statin. Obtain follow-up lipid panel, CMET.  Carotid stenosis, unspecified laterality:  As noted, most recent carotid US demonstrated no ICA stenosis. Continue statin therapy.   Dizziness -     symptoms sound consistent with vertigo. I will give him a prescription for meclizine 12.5 mg twice a day when necessary. I advised him to follow-up with his primary care physician if he continues to have symptoms or if they seem to become more frequent.     Medication Changes: Current medicines are reviewed at length with the patient today.  Concerns regarding medicines are as outlined above.  The following changes have been made:   Discontinued Medications   ASCORBIC ACID (VITAMIN C PO)    Take by mouth daily.   CETIRIZINE (ZYRTEC) 10 MG TABLET    Take 1 tablet (10 mg total) by mouth daily.   CHLORPHENIRAMINE-HYDROCODONE (TUSSIONEX PENNKINETIC ER) 10-8 MG/5ML LQCR    Take 5 mLs by mouth every 12 (twelve) hours as needed.   IPRATROPIUM (ATROVENT) 0.03 % NASAL SPRAY    Place 2 sprays into the nose 4 (four) times daily.   OMEGA-3 FATTY ACIDS (FISH OIL) 1000 MG CAPS    Take by mouth.   SULFAMETHOXAZOLE-TRIMETHOPRIM (BACTRIM DS) 800-160 MG PER TABLET    Take 1 tablet by mouth 2 (two) times daily.   Modified Medications   Modified Medication Previous Medication   LISINOPRIL (PRINIVIL,ZESTRIL) 10 MG TABLET lisinopril (PRINIVIL,ZESTRIL) 10 MG tablet      Take 1 tablet (10 mg total) by mouth daily.    Take 1 tablet (10 mg total) by mouth daily.   ROSUVASTATIN  (CRESTOR) 10 MG TABLET rosuvastatin (CRESTOR) 10 MG tablet      Take 1 tablet (10 mg total) by mouth daily.    TAKE 1 TABLET DAILY (NEED APPOINTMENT, CONTACT OFFICE TO SCHEDULE FOR FUTURE REFILLS)   New Prescriptions   MECLIZINE (ANTIVERT) 25 MG TABLET    Take 0.5 tablets (12.5 mg total) by mouth 2 (two) times daily as needed for dizziness.    Labs/ tests ordered today include:   Orders Placed This Encounter  Procedures  . Lipid panel  .  Comp Met (CMET)  . EKG 12-Lead      Disposition:    FU with me or Dr. Sherren Mocha 1 year   Signed, Versie Starks, MHS 03/10/2015 9:09 AM    [facility] [place], [place], [place]  [address] Phone: [phone]; Fax: [phone]

## 2015-03-10 ENCOUNTER — Encounter: Payer: Self-pay | Admitting: Physician Assistant

## 2015-03-10 ENCOUNTER — Ambulatory Visit (INDEPENDENT_AMBULATORY_CARE_PROVIDER_SITE_OTHER): Payer: BLUE CROSS/BLUE SHIELD | Admitting: Physician Assistant

## 2015-03-10 VITALS — BP 130/70 | HR 68 | Ht 67.0 in | Wt 226.4 lb

## 2015-03-10 DIAGNOSIS — E785 Hyperlipidemia, unspecified: Secondary | ICD-10-CM | POA: Diagnosis not present

## 2015-03-10 DIAGNOSIS — R42 Dizziness and giddiness: Secondary | ICD-10-CM

## 2015-03-10 DIAGNOSIS — I6529 Occlusion and stenosis of unspecified carotid artery: Secondary | ICD-10-CM

## 2015-03-10 DIAGNOSIS — I10 Essential (primary) hypertension: Secondary | ICD-10-CM

## 2015-03-10 LAB — LIPID PANEL
Cholesterol: 123 mg/dL (ref 0–200)
HDL: 40.4 mg/dL (ref >39.00)
LDL Cholesterol: 58 mg/dL (ref 0–99)
NonHDL: 82.97
Total CHOL/HDL Ratio: 3
Triglycerides: 125 mg/dL (ref 0.0–149.0)
VLDL: 25 mg/dL (ref 0.0–40.0)

## 2015-03-10 LAB — COMPREHENSIVE METABOLIC PANEL
ALT: 28 U/L (ref 0–53)
AST: 25 U/L (ref 0–37)
Albumin: 4.2 g/dL (ref 3.5–5.2)
Alkaline Phosphatase: 51 U/L (ref 39–117)
BUN: 17 mg/dL (ref 6–23)
CO2: 28 mEq/L (ref 19–32)
Calcium: 9 mg/dL (ref 8.4–10.5)
Chloride: 106 mEq/L (ref 96–112)
Creatinine, Ser: 1.18 mg/dL (ref 0.40–1.50)
GFR: 68.35 mL/min (ref >60.00)
Glucose, Bld: 110 mg/dL — ABNORMAL HIGH (ref 70–99)
Potassium: 4.2 mEq/L (ref 3.5–5.1)
Sodium: 141 mEq/L (ref 135–145)
Total Bilirubin: 0.8 mg/dL (ref 0.2–1.2)
Total Protein: 6.6 g/dL (ref 6.0–8.3)

## 2015-03-10 MED ORDER — ROSUVASTATIN CALCIUM 10 MG PO TABS
10.0000 mg | ORAL_TABLET | Freq: Every day | ORAL | Status: DC
Start: 2015-03-10 — End: 2015-10-21

## 2015-03-10 MED ORDER — MECLIZINE HCL 25 MG PO TABS: 12.5000 mg | ORAL_TABLET | Freq: Two times a day (BID) | ORAL | Status: DC | PRN

## 2015-03-10 MED ORDER — LISINOPRIL 10 MG PO TABS
10.0000 mg | ORAL_TABLET | Freq: Every day | ORAL | Status: DC
Start: 2015-03-10 — End: 2016-03-15

## 2015-03-10 NOTE — Patient Instructions (Signed)
Medication Instructions:  Your physician has recommended you make the following change in your medication: 1. Meclizine ( 12.5 mg ) twice a day as needed for dizziness   Labwork: Your physician recommends that youhave lab work: cmet/lipid   Testing/Procedures: -None  Follow-Up: Your physician wants you to follow-up in: 1 year with Dr. Excell Seltzer. You will receive a reminder letter in the mail two months in advance. If you don't receive a letter, please call our office to schedule the follow-up appointment.   Any Other Special Instructions Will Be Listed Below (If Applicable).

## 2015-03-16 ENCOUNTER — Telehealth: Payer: Self-pay | Admitting: Cardiovascular Disease

## 2015-03-16 NOTE — Telephone Encounter (Signed)
New Message  Pt returning Rn phone call concerning lab results. Pt stated it would be best to leave results on VM on mobile/home #. Please call back and discuss.

## 2015-03-16 NOTE — Telephone Encounter (Signed)
I left a detailed message on the pt's voicemail in regards to 03/10/15 lab results.

## 2015-09-16 ENCOUNTER — Ambulatory Visit (INDEPENDENT_AMBULATORY_CARE_PROVIDER_SITE_OTHER): Payer: BLUE CROSS/BLUE SHIELD | Admitting: Family Medicine

## 2015-09-16 VITALS — BP 126/76 | HR 87 | Temp 98.5°F | Resp 20 | Ht 67.0 in | Wt 226.0 lb

## 2015-09-16 DIAGNOSIS — J0101 Acute recurrent maxillary sinusitis: Secondary | ICD-10-CM | POA: Diagnosis not present

## 2015-09-16 MED ORDER — AMOXICILLIN 500 MG PO TABS: 1000.0000 mg | ORAL_TABLET | Freq: Two times a day (BID) | ORAL | Status: DC

## 2015-09-16 NOTE — Progress Notes (Signed)
Subjective:    Patient ID: Alexander Lamb, male    DOB: 1960/09/27, 55 y.o.   MRN: [id number]  09/16/2015  URI and Immunizations   HPI This 55 y.o. male presents for evaluation of cold symptoms. Onset eight days ago with recent worsening.  No fever/chills/sweats/body aches.  +HA.  R>L sinus pressure to upper teeth.  Does have a bad tooth on R upper gum.   +nasal congestion yellow blood thick.  +rhinorrhea.  Coughing initially  but has improved.  No v/d.  Benadryl, Tylenol or Excedrin Migraine.  No sneezing recnetly; was sneezing a lot last week. Allergic rhinitis; no regular medication.   Review of Systems  Constitutional: Negative for fever, chills, diaphoresis and fatigue.  HENT: Positive for congestion, dental problem, postnasal drip, rhinorrhea, sinus pressure and voice change. Negative for ear discharge, ear pain, facial swelling, nosebleeds, sore throat and trouble swallowing.   Respiratory: Negative for cough, shortness of breath and wheezing.   Gastrointestinal: Negative for nausea, vomiting and diarrhea.  Neurological: Positive for headaches. Negative for dizziness and light-headedness.    Past Medical History  Diagnosis Date  . Allergy   . Arthritis   . Hyperlipidemia   . Carotid stenosis     a.  Carotid US (05/2013): Bilateral 40-59% ICA stenosis (F/u 1 year);  b.  Carotid US (11/15):  No ICA stenosis  . Hx of echocardiogram     Echo (11/14):  EF 55-60%, normal LV thickness, Gr 1 DD, mild LAE  . Hypertension    History reviewed. No pertinent past surgical history. Allergies  Allergen Reactions  . Sulfa Antibiotics     High fever, chills , shortness of breath    Social History   Social History  . Marital Status: Married    Spouse Name: N/A  . Number of Children: N/A  . Years of Education: N/A   Occupational History  . Not on file.   Social History Main Topics  . Smoking status: Never Smoker   . Smokeless tobacco: Not on file  . Alcohol Use: 0.0 oz/week      1-2 drink(s) per week  . Drug Use: No  . Sexual Activity: Yes    Birth Control/ Protection: None   Other Topics Concern  . Not on file   Social History Narrative   Family History  Problem Relation Age of Onset  . Arthritis Mother   . COPD Mother   . Fibromyalgia Mother   . Cancer Maternal Grandmother        Objective:    BP 126/76 mmHg  Pulse 87  Temp(Src) 98.5 F (36.9 C) (Oral)  Resp 20  Ht  (1.702 m)  Wt 226 lb (102.513 kg)  BMI 35.39 kg/m2  SpO2 98% Physical Exam  Constitutional: He is oriented to person, place, and time. He appears well-developed and well-nourished. No distress.  HENT:  Head: Normocephalic and atraumatic.  Right Ear: Tympanic membrane and ear canal normal.  Left Ear: Tympanic membrane and ear canal normal.  Nose: Mucosal edema and rhinorrhea present. Right sinus exhibits maxillary sinus tenderness. Right sinus exhibits no frontal sinus tenderness. Left sinus exhibits maxillary sinus tenderness. Left sinus exhibits no frontal sinus tenderness.  Mouth/Throat: Oropharynx is clear and moist and mucous membranes are normal. No oral lesions. Normal dentition. No uvula swelling, lacerations or dental caries.  Eyes: Conjunctivae and EOM are normal. Pupils are equal, round, and reactive to light.  Neck: Normal range of motion. Neck supple. Carotid bruit is  not present. No thyromegaly present.  Cardiovascular: Normal rate, regular rhythm, normal heart sounds and intact distal pulses.  Exam reveals no gallop and no friction rub.   No murmur heard. Pulmonary/Chest: Effort normal and breath sounds normal. He has no wheezes. He has no rales.  Lymphadenopathy:    He has no cervical adenopathy.  Neurological: He is alert and oriented to person, place, and time. No cranial nerve deficit.  Skin: Skin is warm and dry. No rash noted. He is not diaphoretic.  Psychiatric: He has a normal mood and affect. His behavior is normal.  Nursing note and vitals  reviewed.       Assessment & Plan:   1. Acute recurrent maxillary sinusitis    -New. -Rx for Amoxicillin provided. -Recommend Afrin, Claritin daily. -RTC for acute worsening. -chronic upper R dental issue with mild pain; Amoxicillin will treat any evolving infection.  Follow-up dentist.   No orders of the defined types were placed in this encounter.   Meds ordered this encounter  Medications  . amoxicillin (AMOXIL) 500 MG tablet    Sig: Take 2 tablets (1,000 mg total) by mouth 2 (two) times daily.    Dispense:  40 tablet    Refill:  0    No Follow-up on file.    Jaylanni Eltringham Paulita FujitaMartin Kattleya Kuhnert, M.D. [facility]  [place] [address] [place], [place]  [id number] [phone] phone [phone]) [phone] fax

## 2015-09-16 NOTE — Patient Instructions (Addendum)
IF you received an x-ray today, you will receive an invoice from [facility]. Please contact [facility] at [phone] with questions or concerns regarding your invoice.   IF you received labwork today, you will receive an invoice from [facility]. Please contact [facility] at [phone] with questions or concerns regarding your invoice.   Our billing staff will not be able to assist you with questions regarding bills from these companies.  You will be contacted with the lab results as soon as they are available. The fastest way to get your results is to activate your My Chart account. Instructions are located on the last page of this paperwork. If you have not heard from us regarding the results in 2 weeks, please contact this office.     Take Afrin twice daily for five days. Recommend Claritin or Zyrtec or Allegra one tablet daily.   Sinusitis, Adult Sinusitis is redness, soreness, and inflammation of the paranasal sinuses. Paranasal sinuses are air pockets within the bones of your face. They are located beneath your eyes, in the middle of your forehead, and above your eyes. In healthy paranasal sinuses, mucus is able to drain out, and air is able to circulate through them by way of your nose. However, when your paranasal sinuses are inflamed, mucus and air can become trapped. This can allow bacteria and other germs to grow and cause infection. Sinusitis can develop quickly and last only a short time (acute) or continue over a long period (chronic). Sinusitis that lasts for more than 12 weeks is considered chronic. CAUSES Causes of sinusitis include:  Allergies.  Structural abnormalities, such as displacement of the cartilage that separates your nostrils (deviated septum), which can decrease the air flow through your nose and sinuses and affect sinus drainage.  Functional abnormalities, such as when the small hairs (cilia) that line your  sinuses and help remove mucus do not work properly or are not present. SIGNS AND SYMPTOMS Symptoms of acute and chronic sinusitis are the same. The primary symptoms are pain and pressure around the affected sinuses. Other symptoms include:  Upper toothache.  Earache.  Headache.  Bad breath.  Decreased sense of smell and taste.  A cough, which worsens when you are lying flat.  Fatigue.  Fever.  Thick drainage from your nose, which often is green and may contain pus (purulent).  Swelling and warmth over the affected sinuses. DIAGNOSIS Your health care provider will perform a physical exam. During your exam, your health care provider may perform any of the following to help determine if you have acute sinusitis or chronic sinusitis:  Look in your nose for signs of abnormal growths in your nostrils (nasal polyps).  Tap over the affected sinus to check for signs of infection.  View the inside of your sinuses using an imaging device that has a light attached (endoscope). If your health care provider suspects that you have chronic sinusitis, one or more of the following tests may be recommended:  Allergy tests.  Nasal culture. A sample of mucus is taken from your nose, sent to a lab, and screened for bacteria.  Nasal cytology. A sample of mucus is taken from your nose and examined by your health care provider to determine if your sinusitis is related to an allergy. TREATMENT Most cases of acute sinusitis are related to a viral infection and will resolve on their own within 10 days. Sometimes, medicines are prescribed to help relieve symptoms of both acute and  chronic sinusitis. These may include pain medicines, decongestants, nasal steroid sprays, or saline sprays. However, for sinusitis related to a bacterial infection, your health care provider will prescribe antibiotic medicines. These are medicines that will help kill the bacteria causing the infection. Rarely, sinusitis is  caused by a fungal infection. In these cases, your health care provider will prescribe antifungal medicine. For some cases of chronic sinusitis, surgery is needed. Generally, these are cases in which sinusitis recurs more than 3 times per year, despite other treatments. HOME CARE INSTRUCTIONS  Drink plenty of water. Water helps thin the mucus so your sinuses can drain more easily.  Use a humidifier.  Inhale steam 3-4 times a day (for example, sit in the bathroom with the shower running).  Apply a warm, moist washcloth to your face 3-4 times a day, or as directed by your health care provider.  Use saline nasal sprays to help moisten and clean your sinuses.  Take medicines only as directed by your health care provider.  If you were prescribed either an antibiotic or antifungal medicine, finish it all even if you start to feel better. SEEK IMMEDIATE MEDICAL CARE IF:  You have increasing pain or severe headaches.  You have nausea, vomiting, or drowsiness.  You have swelling around your face.  You have vision problems.  You have a stiff neck.  You have difficulty breathing.   This information is not intended to replace advice given to you by your health care provider. Make sure you discuss any questions you have with your health care provider.   Document Released: 06/18/2005 Document Revised: 07/09/2014 Document Reviewed: 07/03/2011 Elsevier Interactive Patient Education [employer]

## 2015-09-20 ENCOUNTER — Telehealth: Payer: Self-pay | Admitting: Family Medicine

## 2015-09-20 NOTE — Telephone Encounter (Signed)
Called patient to see if they have had there flu shot.  If they have please document it.  If they have not please ask them to come in.  If they decline please document in health maintenance.                         Also need to find out if patient has had a colonoscopy and if so where and when?  If not ask if we may order one for him?

## 2015-10-21 ENCOUNTER — Other Ambulatory Visit: Payer: Self-pay | Admitting: *Deleted

## 2015-10-21 DIAGNOSIS — E785 Hyperlipidemia, unspecified: Secondary | ICD-10-CM

## 2015-10-21 MED ORDER — ROSUVASTATIN CALCIUM 10 MG PO TABS
10.0000 mg | ORAL_TABLET | Freq: Every day | ORAL | Status: DC
Start: 2015-10-21 — End: 2016-03-15

## 2016-02-28 ENCOUNTER — Encounter: Payer: Self-pay | Admitting: Cardiovascular Disease

## 2016-03-15 ENCOUNTER — Encounter: Payer: Self-pay | Admitting: Cardiovascular Disease

## 2016-03-15 ENCOUNTER — Ambulatory Visit (INDEPENDENT_AMBULATORY_CARE_PROVIDER_SITE_OTHER): Payer: BLUE CROSS/BLUE SHIELD | Admitting: Cardiovascular Disease

## 2016-03-15 ENCOUNTER — Encounter (INDEPENDENT_AMBULATORY_CARE_PROVIDER_SITE_OTHER): Payer: Self-pay

## 2016-03-15 VITALS — BP 130/80 | HR 92 | Ht 67.0 in | Wt 225.0 lb

## 2016-03-15 DIAGNOSIS — E785 Hyperlipidemia, unspecified: Secondary | ICD-10-CM | POA: Diagnosis not present

## 2016-03-15 DIAGNOSIS — I10 Essential (primary) hypertension: Secondary | ICD-10-CM

## 2016-03-15 MED ORDER — ROSUVASTATIN CALCIUM 10 MG PO TABS: 10.0000 mg | ORAL_TABLET | Freq: Every day | ORAL | 3 refills | Status: DC

## 2016-03-15 MED ORDER — MECLIZINE HCL 25 MG PO TABS: 12.5000 mg | ORAL_TABLET | Freq: Two times a day (BID) | ORAL | 1 refills | Status: DC | PRN

## 2016-03-15 MED ORDER — LISINOPRIL 10 MG PO TABS: 10.0000 mg | ORAL_TABLET | Freq: Every day | ORAL | 3 refills | Status: DC

## 2016-03-15 NOTE — Progress Notes (Signed)
Cardiology Office Note Date:  03/15/2016   ID:  Alexander Lamb, DOB 12-02-1960, MRN [id number]  PCP:  Janace Hoard, MD  Cardiologist:  Tonny Bollman, MD    Chief Complaint  Patient presents with  . Follow-up    BP has been up lately, per pt     History of Present Illness: Alexander Lamb is a 55 y.o. male who presents for follow-up evaluation. He has been followed for HTN, hyperlipidemia, and carotid stenosis.   The patient complains of dizziness with turning his head certain directions. Only occurs when he's standing upright. Otherwise no complaints. No chest pain or pressure, dyspnea, or edema. No palpitations.   Past Medical History:  Diagnosis Date  . Allergy   . Arthritis   . Carotid stenosis    a.  Carotid US (05/2013): Bilateral 40-59% ICA stenosis (F/u 1 year);  b.  Carotid US (11/15):  No ICA stenosis  . Hx of echocardiogram    Echo (11/14):  EF 55-60%, normal LV thickness, Gr 1 DD, mild LAE  . Hyperlipidemia   . Hypertension     History reviewed. No pertinent surgical history.  Current Outpatient Prescriptions  Medication Sig Dispense Refill  . amoxicillin (AMOXIL) 500 MG tablet Take 2 tablets (1,000 mg total) by mouth 2 (two) times daily. 40 tablet 0  . aspirin-acetaminophen-caffeine (EXCEDRIN MIGRAINE) 250-250-65 MG per tablet Take 1 tablet by mouth every 6 (six) hours as needed for pain.    Marland Kitchen CIALIS 5 MG tablet Take 5 mg by mouth daily as needed. Reported on 09/16/2015  5  . clomiPHENE (CLOMID) 50 MG tablet Take 50 mg by mouth daily.    Marland Kitchen ibuprofen (ADVIL,MOTRIN) 200 MG tablet Take 200 mg by mouth every 6 (six) hours as needed.    Marland Kitchen lisinopril (PRINIVIL,ZESTRIL) 10 MG tablet Take 1 tablet (10 mg total) by mouth daily. 30 tablet 11  . meclizine (ANTIVERT) 25 MG tablet Take 0.5 tablets (12.5 mg total) by mouth 2 (two) times daily as needed for dizziness. 30 tablet 1  . Melatonin 10 MG TABS Take by mouth at bedtime.    . naproxen sodium (ANAPROX) 220 MG tablet  Take 220 mg by mouth daily as needed (pain).    . rosuvastatin (CRESTOR) 10 MG tablet Take 1 tablet (10 mg total) by mouth daily. 90 tablet 1  . S-Adenosylmethionine (SAM-E) 200 MG TABS Take 400 mg by mouth once a week.     No current facility-administered medications for this visit.     Allergies:   Sulfa antibiotics   Social History:  The patient  reports that he has never smoked. He has never used smokeless tobacco. He reports that he drinks alcohol. He reports that he does not use drugs.   Family History:  The patient's  family history includes Arthritis in his mother; COPD in his mother; Cancer in his maternal grandmother; Fibromyalgia in his mother.    ROS:  Please see the history of present illness.  Otherwise, review of systems is positive for cough, dizziness, snoring, headaches, balance problems.  All other systems are reviewed and negative.    PHYSICAL EXAM: VS:  BP 130/80   Pulse 92   Ht 5\' 7"  (1.702 m)   Wt 102.1 kg (225 lb)   SpO2 97%   BMI 35.24 kg/m  , BMI Body mass index is 35.24 kg/m. GEN: Well nourished, well developed, in no acute distress  HEENT: normal  Neck: no JVD, no masses. No carotid bruits  Cardiac: RRR without murmur or gallop                Respiratory:  clear to auscultation bilaterally, normal work of breathing GI: soft, nontender, nondistended, + BS MS: no deformity or atrophy  Ext: no pretibial edema, pedal pulses 2+= bilaterally Skin: warm and dry, no rash Neuro:  Strength and sensation are intact Psych: euthymic mood, full affect  EKG:  EKG is ordered today. The ekg ordered today shows NSR, left axis deviation, HR 72 bpm  Recent Labs: No results found for requested labs within last 8760 hours.   Lipid Panel     Component Value Date/Time   CHOL 123 03/10/2015 0930   TRIG 125.0 03/10/2015 0930   HDL 40.40 03/10/2015 0930   CHOLHDL 3 03/10/2015 0930   VLDL 25.0 03/10/2015 0930   LDLCALC 58 03/10/2015 0930   LDLDIRECT 140.0  05/13/2013 1306      Wt Readings from Last 3 Encounters:  03/15/16 102.1 kg (225 lb)  09/16/15 102.5 kg (226 lb)  03/10/15 102.7 kg (226 lb 6.4 oz)     Cardiac Studies Reviewed: Echo 05-26-2013: Study Conclusions  - Left ventricle: The cavity size was normal. Wall thickness was normal. Systolic function was normal. The estimated ejection fraction was in the range of 55% to 60%. Wall motion was normal; there were no regional wall motion abnormalities. Doppler parameters are consistent with abnormal left ventricular relaxation (grade 1 diastolic dysfunction). - Aortic valve: There was no stenosis. - Mitral valve: No significant regurgitation. - Left atrium: The atrium was mildly dilated. - Right ventricle: The cavity size was normal. Systolic function was normal. - Pulmonary arteries: No complete TR doppler jet so unable to estimate PA systolic pressure. - Inferior vena cava: The vessel was normal in size; the respirophasic diameter changes were in the normal range (= 50%); findings are consistent with normal central venous pressure. Impressions:  - No definite etiology for murmur noted.  Carotid Duplex 05/11/2014: Examination Data Normal carotid arteries, without visualized plaque, bilaterally. Patent vertebral arteries with antegrade flow. Normal subclavian arteries, bilaterally.  ASSESSMENT AND PLAN: 1.  Essential HTN: BP controlled on lisinopril. Check BMET. Na restriction discussed.  2. Carotid artery evaluation: last study showed normal carotids. No FU indicated.   3. Hyperlipidemia: continue crestor. Check lipids/LFT's. Lifestyle/diet counseling done.  Current medicines are reviewed with the patient today.  The patient does not have concerns regarding medicines.  Labs/ tests ordered today include:  No orders of the defined types were placed in this encounter.   Disposition:   FU one year  Signed, Tonny Bollmanooper, Ahmyah Gidley, MD  03/15/2016 4:11  PM    [facility] [address], [place], [place]  [address] Phone: [phone]; Fax: [phone]

## 2016-03-15 NOTE — Patient Instructions (Addendum)
Medication Instructions:  Your physician recommends that you continue on your current medications as directed. Please refer to the Current Medication list given to you today.  Labwork: Your physician recommends that you return for a FASTING CBC, CMP and LIPID--nothing to eat or drink after midnight, lab opens at 7:30 AM (the pt will call back to schedule lab appointment)  Testing/Procedures: No new orders.   Follow-Up: Your physician wants you to follow-up in: 1 YEAR with Dr Excell Seltzerooper.  You will receive a reminder letter in the mail two months in advance. If you don't receive a letter, please call our office to schedule the follow-up appointment.   Any Other Special Instructions Will Be Listed Below (If Applicable).  Your physician has requested that you regularly monitor and record your blood pressure readings at home (2-3 times per week). Please use the same machine at the same time of day to check your readings and record them to bring to your follow-up visit. We would like to see your BP below 140/90 on a consistent basis.  Please contact the office if your BP is elevated.   If you need a refill on your cardiac medications before your next appointment, please call your pharmacy.

## 2016-03-30 ENCOUNTER — Other Ambulatory Visit: Payer: Self-pay | Admitting: Physician Assistant

## 2016-08-04 DIAGNOSIS — M7710 Lateral epicondylitis, unspecified elbow: Secondary | ICD-10-CM | POA: Diagnosis not present

## 2016-08-04 DIAGNOSIS — M9902 Segmental and somatic dysfunction of thoracic region: Secondary | ICD-10-CM | POA: Diagnosis not present

## 2016-08-04 DIAGNOSIS — M791 Myalgia: Secondary | ICD-10-CM | POA: Diagnosis not present

## 2016-08-04 DIAGNOSIS — M9901 Segmental and somatic dysfunction of cervical region: Secondary | ICD-10-CM | POA: Diagnosis not present

## 2016-08-26 ENCOUNTER — Encounter (HOSPITAL_COMMUNITY): Payer: Self-pay | Admitting: Emergency Medicine

## 2016-08-26 ENCOUNTER — Ambulatory Visit (HOSPITAL_COMMUNITY)
Admission: EM | Admit: 2016-08-26 | Discharge: 2016-08-26 | Disposition: A | Discharge status: Home / Self Care | Payer: BLUE CROSS/BLUE SHIELD | Attending: Family Medicine | Admitting: Family Medicine

## 2016-08-26 DIAGNOSIS — B9789 Other viral agents as the cause of diseases classified elsewhere: Secondary | ICD-10-CM

## 2016-08-26 DIAGNOSIS — J069 Acute upper respiratory infection, unspecified: Secondary | ICD-10-CM

## 2016-08-26 NOTE — ED Provider Notes (Signed)
CSN: [id number]     Arrival date & time 08/26/16  1807 History   First MD Initiated Contact with Patient 08/26/16 1828     Chief Complaint  Patient presents with  . Abdominal Pain   (Consider location/radiation/quality/duration/timing/severity/associated sxs/prior Treatment) 56 year old male patient presents to clinic with three-day history of cough, and congestion. He has also had some lower abdominal pain, he reports he had some constipation for 1-2 days, however since resolved having a normal bowel movement today. Reports low-grade fever at home at 100 treated with Tylenol and ibuprofen. He has no nausea, he has not vomited, and he has no diarrhea. His cough is dry, hacking, nonproductive, he has no history of smoking, no history of lung disease such as asthma. emphysema, or COPD.   The history is provided by the patient.  Abdominal Pain    Past Medical History:  Diagnosis Date  . Allergy   . Arthritis   . Carotid stenosis    a.  Carotid US (05/2013): Bilateral 40-59% ICA stenosis (F/u 1 year);  b.  Carotid US (11/15):  No ICA stenosis  . Hx of echocardiogram    Echo (11/14):  EF 55-60%, normal LV thickness, Gr 1 DD, mild LAE  . Hyperlipidemia   . Hypertension    History reviewed. No pertinent surgical history. Family History  Problem Relation Age of Onset  . Arthritis Mother   . COPD Mother   . Fibromyalgia Mother   . Cancer Maternal Grandmother    Social History  Substance Use Topics  . Smoking status: Never Smoker  . Smokeless tobacco: Never Used  . Alcohol use 0.0 oz/week    1 - 2 Standard drinks or equivalent per week    Review of Systems  Reason unable to perform ROS: As covered in history of present illness.  Gastrointestinal: Positive for abdominal pain.  All other systems reviewed and are negative.   Allergies  Sulfa antibiotics  Home Medications   Prior to Admission medications   Medication Sig Start Date End Date Taking? Authorizing Provider   CIALIS 5 MG tablet Take 5 mg by mouth daily as needed. Reported on 09/16/2015 01/10/15  Yes Historical Provider, MD  lisinopril (PRINIVIL,ZESTRIL) 10 MG tablet Take 1 tablet (10 mg total) by mouth daily. 03/15/16  Yes Tonny BollmanMichael Cooper, MD  rosuvastatin (CRESTOR) 10 MG tablet Take 1 tablet (10 mg total) by mouth daily. 03/15/16  Yes Tonny BollmanMichael Cooper, MD   Meds Ordered and Administered this Visit  Medications - No data to display  BP 128/61 (BP Location: Right Arm)   Pulse 78   Temp 98.9 F (37.2 C) (Oral)   Resp 20   SpO2 98%  No data found.   Physical Exam  Constitutional: He is oriented to person, place, and time. He appears well-developed and well-nourished. No distress.  HENT:  Head: Normocephalic and atraumatic.  Right Ear: Tympanic membrane and external ear normal.  Left Ear: Tympanic membrane and external ear normal.  Nose: Nose normal. Right sinus exhibits no maxillary sinus tenderness and no frontal sinus tenderness. Left sinus exhibits no maxillary sinus tenderness and no frontal sinus tenderness.  Mouth/Throat: Uvula is midline and oropharynx is clear and moist. No oropharyngeal exudate.  Eyes: Pupils are equal, round, and reactive to light.  Neck: Normal range of motion. Neck supple. No JVD present.  Cardiovascular: Normal rate and regular rhythm.   Pulmonary/Chest: Effort normal and breath sounds normal. No respiratory distress. He has no wheezes.  Abdominal: Soft. Bowel sounds  are increased. There is no hepatosplenomegaly. There is no tenderness. There is no rigidity, no rebound, no guarding, no CVA tenderness, no tenderness at McBurney's point and negative Murphy's sign.  Lymphadenopathy:       Head (right side): No submental, no submandibular, no tonsillar and no preauricular adenopathy present.       Head (left side): No submental, no submandibular and no tonsillar adenopathy present.    He has no cervical adenopathy.  Neurological: He is alert and oriented to person,  place, and time.  Skin: Skin is warm and dry. Capillary refill takes less than 2 seconds. No rash noted. He is not diaphoretic. No erythema.  Psychiatric: He has a normal mood and affect.  Nursing note and vitals reviewed.   Urgent Care Course     Procedures (including critical care time)  Labs Review Labs Reviewed - No data to display  Imaging Review No results found.     MDM   1. Viral URI with cough    You most likely have a viral URI, I advise rest, plenty of fluids and management of symptoms with over the counter medicines. For symptoms you may take Tylenol as needed every 4-6 hours for body aches or fever, not to exceed 4,000 mg a day, Take mucinex or mucinex DM ever 12 hours with a full glass of water, you may use an inhaled steroid such as Flonase, 2 sprays each nostril once a day for congestion, or an antihistamine such as Claritin or Zyrtec once a day. Should your symptoms worsen or fail to resolve, follow up with your primary care provider or return to clinic.       Dorena Bodo, NP 08/26/16 [phone]

## 2016-08-26 NOTE — ED Triage Notes (Signed)
[facility] with a complaint of lower abdominal pain and bloating that started today. The patient reported a cough and congestion that started 3 days ago.

## 2016-08-26 NOTE — Discharge Instructions (Signed)
You most likely have a viral URI, I advise rest, plenty of fluids and management of symptoms with over the counter medicines. For symptoms you may take Tylenol as needed every 4-6 hours for body aches or fever, not to exceed 4,000 mg a day, Take mucinex or mucinex DM ever 12 hours with a full glass of water, you may use an inhaled steroid such as Flonase, 2 sprays each nostril once a day for congestion, or an antihistamine such as Claritin or Zyrtec once a day. Should your symptoms worsen or fail to resolve, follow up with your primary care provider or return to clinic.  °

## 2016-08-31 ENCOUNTER — Encounter (HOSPITAL_COMMUNITY): Payer: Self-pay

## 2016-08-31 ENCOUNTER — Ambulatory Visit (INDEPENDENT_AMBULATORY_CARE_PROVIDER_SITE_OTHER): Payer: BLUE CROSS/BLUE SHIELD

## 2016-08-31 ENCOUNTER — Ambulatory Visit (HOSPITAL_COMMUNITY)
Admission: EM | Admit: 2016-08-31 | Discharge: 2016-08-31 | Disposition: A | Discharge status: Home / Self Care | Payer: BLUE CROSS/BLUE SHIELD | Attending: Family Medicine | Admitting: Family Medicine

## 2016-08-31 DIAGNOSIS — B9789 Other viral agents as the cause of diseases classified elsewhere: Secondary | ICD-10-CM

## 2016-08-31 DIAGNOSIS — R0602 Shortness of breath: Secondary | ICD-10-CM | POA: Diagnosis not present

## 2016-08-31 DIAGNOSIS — J069 Acute upper respiratory infection, unspecified: Secondary | ICD-10-CM

## 2016-08-31 DIAGNOSIS — R05 Cough: Secondary | ICD-10-CM | POA: Diagnosis not present

## 2016-08-31 NOTE — ED Triage Notes (Signed)
Fever of 99.6, feeling bad and cough, upset stomach for 2 hours today. Did not take any otc medication.

## 2016-08-31 NOTE — Discharge Instructions (Signed)
Lots of fluids, mucinex or delsym for cough

## 2016-08-31 NOTE — ED Provider Notes (Signed)
[facility]    CSN: [id number] Arrival date & time: 08/31/16  1213     History   Chief Complaint Chief Complaint  Patient presents with  . URI    HPI Alexander Lamb is a 56 y.o. male.   The history is provided by the patient.  URI  Presenting symptoms: congestion, cough, fever and rhinorrhea   Severity:  Mild Onset quality:  Gradual Duration:  1 week Progression:  Worsening Chronicity:  New Relieved by:  None tried Risk factors: sick contacts     Past Medical History:  Diagnosis Date  . Allergy   . Arthritis   . Carotid stenosis    a.  Carotid US (05/2013): Bilateral 40-59% ICA stenosis (F/u 1 year);  b.  Carotid US (11/15):  No ICA stenosis  . Hx of echocardiogram    Echo (11/14):  EF 55-60%, normal LV thickness, Gr 1 DD, mild LAE  . Hyperlipidemia   . Hypertension     Patient Active Problem List   Diagnosis Date Noted  . HTN (hypertension) 06/09/2013  . Hyperlipidemia, mixed 01/01/2013    History reviewed. No pertinent surgical history.     Home Medications    Prior to Admission medications   Medication Sig Start Date End Date Taking? Authorizing Provider  CIALIS 5 MG tablet Take 5 mg by mouth daily as needed. Reported on 09/16/2015 01/10/15  Yes Historical Provider, MD  clomiPHENE (CLOMID) 50 MG tablet Take by mouth daily.   Yes Historical Provider, MD  lisinopril (PRINIVIL,ZESTRIL) 10 MG tablet Take 1 tablet (10 mg total) by mouth daily. 03/15/16  Yes Tonny Bollman, MD  rosuvastatin (CRESTOR) 10 MG tablet Take 1 tablet (10 mg total) by mouth daily. 03/15/16  Yes Tonny Bollman, MD    Family History Family History  Problem Relation Age of Onset  . Arthritis Mother   . COPD Mother   . Fibromyalgia Mother   . Cancer Maternal Grandmother     Social History Social History  Substance Use Topics  . Smoking status: Never Smoker  . Smokeless tobacco: Never Used  . Alcohol use 0.0 oz/week    1 - 2 Standard drinks or equivalent per week       Allergies   Sulfa antibiotics   Review of Systems Review of Systems  Constitutional: Positive for fever.  HENT: Positive for congestion and rhinorrhea.   Respiratory: Positive for cough.   Cardiovascular: Negative.   Gastrointestinal: Negative.   Genitourinary: Negative.   All other systems reviewed and are negative.    Physical Exam Triage Vital Signs ED Triage Vitals [08/31/16 1223]  Enc Vitals Group     BP 136/71     Pulse Rate 69     Resp 20     Temp 98.4 F (36.9 C)     Temp Source Oral     SpO2 99 %     Weight      Height      Head Circumference      Peak Flow      Pain Score 0     Pain Loc      Pain Edu?      Excl. in GC?    No data found.   Updated Vital Signs BP 136/71 (BP Location: Left Arm)   Pulse 69   Temp 98.4 F (36.9 C) (Oral)   Resp 20   SpO2 99%   Visual Acuity Right Eye Distance:   Left Eye Distance:  Bilateral Distance:    Right Eye Near:   Left Eye Near:    Bilateral Near:     Physical Exam  Constitutional: He appears well-developed and well-nourished. No distress.  HENT:  Right Ear: External ear normal.  Left Ear: External ear normal.  Nose: Nose normal.  Mouth/Throat: Oropharynx is clear and moist.  Eyes: Pupils are equal, round, and reactive to light.  Neck: Normal range of motion. Neck supple.  Cardiovascular: Normal rate, regular rhythm, normal heart sounds and intact distal pulses.   Pulmonary/Chest: Effort normal and breath sounds normal.  Abdominal: Soft. Bowel sounds are normal.  Musculoskeletal: Normal range of motion.  Skin: Skin is warm and dry.  Nursing note and vitals reviewed.    UC Treatments / Results  Labs (all labs ordered are listed, but only abnormal results are displayed) Labs Reviewed - No data to display  EKG  EKG Interpretation None       Radiology Dg Chest 2 View  Result Date: 08/31/2016 CLINICAL DATA:  Fever, cough, body ache, shortness of Breath EXAM: CHEST  2 VIEW  COMPARISON:  None. FINDINGS: Cardiomediastinal silhouette is unremarkable. No infiltrate or pleural effusion. No pulmonary edema. Mild perihilar bronchitic changes. Mild degenerative changes mid and lower thoracic spine IMPRESSION: No infiltrate or pulmonary edema. Mild perihilar bronchitic changes. Degenerative changes thoracic spine. Electronically Signed   By: Natasha MeadLiviu  Pop M.D.   On: 08/31/2016 12:40   X-rays reviewed and report per radiologist.  Procedures Procedures (including critical care time)  Medications Ordered in UC Medications - No data to display   Initial Impression / Assessment and Plan / UC Course  I have reviewed the triage vital signs and the nursing notes.  Pertinent labs & imaging results that were available during my care of the patient were reviewed by me and considered in my medical decision making (see chart for details).       Final Clinical Impressions(s) / UC Diagnoses   Final diagnoses:  Viral URI with cough    New Prescriptions New Prescriptions   No medications on file     Linna HoffJames D Myan Suit, MD 08/31/16 1309

## 2016-09-06 DIAGNOSIS — E291 Testicular hypofunction: Secondary | ICD-10-CM | POA: Diagnosis not present

## 2016-09-12 DIAGNOSIS — E291 Testicular hypofunction: Secondary | ICD-10-CM | POA: Diagnosis not present

## 2016-09-12 DIAGNOSIS — R351 Nocturia: Secondary | ICD-10-CM | POA: Diagnosis not present

## 2016-09-12 DIAGNOSIS — N401 Enlarged prostate with lower urinary tract symptoms: Secondary | ICD-10-CM | POA: Diagnosis not present

## 2016-10-05 DIAGNOSIS — H25813 Combined forms of age-related cataract, bilateral: Secondary | ICD-10-CM | POA: Diagnosis not present

## 2016-10-05 DIAGNOSIS — H527 Unspecified disorder of refraction: Secondary | ICD-10-CM | POA: Diagnosis not present

## 2016-10-05 DIAGNOSIS — H40003 Preglaucoma, unspecified, bilateral: Secondary | ICD-10-CM | POA: Diagnosis not present

## 2016-10-12 ENCOUNTER — Encounter (HOSPITAL_COMMUNITY): Payer: Self-pay | Admitting: Family Medicine

## 2016-10-12 ENCOUNTER — Ambulatory Visit (HOSPITAL_COMMUNITY)
Admission: EM | Admit: 2016-10-12 | Discharge: 2016-10-12 | Disposition: A | Discharge status: Home / Self Care | Payer: BLUE CROSS/BLUE SHIELD | Attending: Family Medicine | Admitting: Family Medicine

## 2016-10-12 DIAGNOSIS — H53149 Visual discomfort, unspecified: Secondary | ICD-10-CM

## 2016-10-12 DIAGNOSIS — R110 Nausea: Secondary | ICD-10-CM | POA: Diagnosis not present

## 2016-10-12 DIAGNOSIS — G44209 Tension-type headache, unspecified, not intractable: Secondary | ICD-10-CM

## 2016-10-12 MED ORDER — KETOROLAC TROMETHAMINE 30 MG/ML IJ SOLN
30.0000 mg | Freq: Once | INTRAMUSCULAR | Status: AC
  Administered 2016-10-12: 30 mg via INTRAMUSCULAR

## 2016-10-12 MED ORDER — KETOROLAC TROMETHAMINE 30 MG/ML IJ SOLN
INTRAMUSCULAR | Status: AC
  Filled 2016-10-12: qty 1

## 2016-10-12 MED ORDER — DEXAMETHASONE SODIUM PHOSPHATE 10 MG/ML IJ SOLN
INTRAMUSCULAR | Status: AC
  Filled 2016-10-12: qty 1

## 2016-10-12 MED ORDER — ONDANSETRON 4 MG PO TBDP: 4.0000 mg | ORAL_TABLET | Freq: Three times a day (TID) | ORAL | 0 refills | Status: DC | PRN

## 2016-10-12 MED ORDER — DEXAMETHASONE SODIUM PHOSPHATE 10 MG/ML IJ SOLN
10.0000 mg | Freq: Once | INTRAMUSCULAR | Status: AC
Start: 2016-10-12 — End: 2016-10-12
  Administered 2016-10-12: 10 mg via INTRAMUSCULAR

## 2016-10-12 NOTE — ED Triage Notes (Signed)
Pt here for headache mostly around the left eye for 24 hours. sts some nausea, photophobia. sts he took Excedrin and it relieved some.

## 2016-10-12 NOTE — Discharge Instructions (Signed)
Your headache has been treated today with an injection of toradol and dexamethasone. I have sent a prescription to your pharmacy for Zofran for nausea. You may take Tylenol every 4-6 hours for pain or ibuprofen every 6-8 hours. If your pain persists, follow up with a primary care provider or return to clinic.

## 2016-10-12 NOTE — ED Provider Notes (Signed)
CSN: [id number]     Arrival date & time 10/12/16  1020 History   First MD Initiated Contact with Patient 10/12/16 1041     Chief Complaint  Patient presents with  . Headache   (Consider location/radiation/quality/duration/timing/severity/associated sxs/prior Treatment) 56 year old male presents to clinic for evaluation of a headache.     Headache  Pain location:  L temporal Quality:  Dull and stabbing Radiates to:  Does not radiate Severity currently:  3/10 Severity at highest:  8/10 Onset quality:  Sudden Duration:  1 day Timing:  Constant Progression:  Improving Chronicity:  Recurrent Similar to prior headaches: yes   Context: activity and bright light   Context: not straining   Relieved by:  Acetaminophen and NSAIDs Worsened by:  Activity and light Associated symptoms: nausea and photophobia   Associated symptoms: no abdominal pain, no blurred vision, no congestion, no cough, no diarrhea, no dizziness, no ear pain, no eye pain, no facial pain, no fever, no loss of balance, no neck pain, no neck stiffness, no numbness, no sinus pressure, no vomiting and no weakness   Nausea:    Severity:  Mild   Onset quality:  Gradual   Duration:  2 days   Timing:  Intermittent   Progression:  Improving   Past Medical History:  Diagnosis Date  . Allergy   . Arthritis   . Carotid stenosis    a.  Carotid US (05/2013): Bilateral 40-59% ICA stenosis (F/u 1 year);  b.  Carotid US (11/15):  No ICA stenosis  . Hx of echocardiogram    Echo (11/14):  EF 55-60%, normal LV thickness, Gr 1 DD, mild LAE  . Hyperlipidemia   . Hypertension    History reviewed. No pertinent surgical history. Family History  Problem Relation Age of Onset  . Arthritis Mother   . COPD Mother   . Fibromyalgia Mother   . Cancer Maternal Grandmother    Social History  Substance Use Topics  . Smoking status: Never Smoker  . Smokeless tobacco: Never Used  . Alcohol use 0.0 oz/week    1 - 2 Standard drinks or  equivalent per week    Review of Systems  Constitutional: Negative for diaphoresis and fever.  HENT: Negative for congestion, ear pain, sinus pain and sinus pressure.   Eyes: Positive for photophobia. Negative for blurred vision, pain and visual disturbance.  Respiratory: Negative for cough and shortness of breath.   Cardiovascular: Negative for chest pain and palpitations.  Gastrointestinal: Positive for nausea. Negative for abdominal pain, diarrhea and vomiting.  Musculoskeletal: Negative for neck pain and neck stiffness.  Skin: Negative.   Neurological: Positive for headaches. Negative for dizziness, weakness, numbness and loss of balance.    Allergies  Sulfa antibiotics  Home Medications   Prior to Admission medications   Medication Sig Start Date End Date Taking? Authorizing Provider  CIALIS 5 MG tablet Take 5 mg by mouth daily as needed. Reported on 09/16/2015 01/10/15   Historical Provider, MD  clomiPHENE (CLOMID) 50 MG tablet Take by mouth daily.    Historical Provider, MD  lisinopril (PRINIVIL,ZESTRIL) 10 MG tablet Take 1 tablet (10 mg total) by mouth daily. 03/15/16   Tonny Bollman, MD  ondansetron (ZOFRAN ODT) 4 MG disintegrating tablet Take 1 tablet (4 mg total) by mouth every 8 (eight) hours as needed for nausea or vomiting. 10/12/16   Dorena Bodo, NP  rosuvastatin (CRESTOR) 10 MG tablet Take 1 tablet (10 mg total) by mouth daily. 03/15/16  Tonny Bollman, MD   Meds Ordered and Administered this Visit   Medications  ketorolac (TORADOL) 30 MG/ML injection 30 mg (30 mg Intramuscular Given 10/12/16 1103)  dexamethasone (DECADRON) injection 10 mg (10 mg Intramuscular Given 10/12/16 1103)    BP 127/76   Pulse 81   Temp 98 F (36.7 C)   Resp 18   SpO2 98%  No data found.   Physical Exam  Constitutional: He is oriented to person, place, and time. He appears well-developed and well-nourished. No distress.  HENT:  Head: Normocephalic and atraumatic.  Right Ear:  External ear normal.  Left Ear: External ear normal.  Mouth/Throat: Oropharynx is clear and moist.  Eyes: Conjunctivae are normal. Pupils are equal, round, and reactive to light. Right eye exhibits no discharge. Left eye exhibits no discharge.  Neck: Normal range of motion. Neck supple.  Cardiovascular: Normal rate and regular rhythm.   Pulmonary/Chest: Effort normal and breath sounds normal.  Abdominal: Soft. Bowel sounds are normal.  Lymphadenopathy:    He has no cervical adenopathy.  Neurological: He is alert and oriented to person, place, and time. No cranial nerve deficit. He exhibits normal muscle tone. Coordination normal.  Skin: Skin is warm and dry. Capillary refill takes less than 2 seconds. He is not diaphoretic.  Psychiatric: He has a normal mood and affect. His behavior is normal.  Nursing note and vitals reviewed.   Urgent Care Course     Procedures (including critical care time)  Labs Review Labs Reviewed - No data to display  Imaging Review No results found.     MDM   1. Tension-type headache, not intractable, unspecified chronicity pattern     Headache treated with Toradol, dexamethasone, prescription sent to his pharmacy for Zofran. Advised rest, plenty of fluids, and a work note was provided. Index of suspicion is low for underlying pathology such as aneurysm or tumor, as he has had previous headaches similar to this, and that he states that this headache is not the worst he has ever had.     Dorena Bodo, NP 10/12/16 1116

## 2016-11-15 ENCOUNTER — Other Ambulatory Visit: Payer: Self-pay | Admitting: Cardiovascular Disease

## 2017-01-05 DIAGNOSIS — M7710 Lateral epicondylitis, unspecified elbow: Secondary | ICD-10-CM | POA: Diagnosis not present

## 2017-01-05 DIAGNOSIS — M9902 Segmental and somatic dysfunction of thoracic region: Secondary | ICD-10-CM | POA: Diagnosis not present

## 2017-01-05 DIAGNOSIS — M9901 Segmental and somatic dysfunction of cervical region: Secondary | ICD-10-CM | POA: Diagnosis not present

## 2017-01-05 DIAGNOSIS — M791 Myalgia: Secondary | ICD-10-CM | POA: Diagnosis not present

## 2017-02-12 ENCOUNTER — Other Ambulatory Visit: Payer: Self-pay | Admitting: Cardiovascular Disease

## 2017-03-02 DIAGNOSIS — M9908 Segmental and somatic dysfunction of rib cage: Secondary | ICD-10-CM | POA: Diagnosis not present

## 2017-03-02 DIAGNOSIS — M9902 Segmental and somatic dysfunction of thoracic region: Secondary | ICD-10-CM | POA: Diagnosis not present

## 2017-03-02 DIAGNOSIS — M791 Myalgia: Secondary | ICD-10-CM | POA: Diagnosis not present

## 2017-03-02 DIAGNOSIS — M9901 Segmental and somatic dysfunction of cervical region: Secondary | ICD-10-CM | POA: Diagnosis not present

## 2017-03-19 ENCOUNTER — Emergency Department (HOSPITAL_COMMUNITY)
Admission: EM | Admit: 2017-03-19 | Discharge: 2017-03-19 | Disposition: A | Discharge status: Home / Self Care | Payer: BLUE CROSS/BLUE SHIELD | Attending: Emergency Medicine | Admitting: Emergency Medicine

## 2017-03-19 ENCOUNTER — Encounter (HOSPITAL_COMMUNITY): Payer: Self-pay | Admitting: *Deleted

## 2017-03-19 DIAGNOSIS — Z79899 Other long term (current) drug therapy: Secondary | ICD-10-CM | POA: Diagnosis not present

## 2017-03-19 DIAGNOSIS — B0089 Other herpesviral infection: Secondary | ICD-10-CM | POA: Insufficient documentation

## 2017-03-19 DIAGNOSIS — I10 Essential (primary) hypertension: Secondary | ICD-10-CM | POA: Insufficient documentation

## 2017-03-19 DIAGNOSIS — Z7983 Long term (current) use of bisphosphonates: Secondary | ICD-10-CM | POA: Diagnosis not present

## 2017-03-19 DIAGNOSIS — R600 Localized edema: Secondary | ICD-10-CM | POA: Diagnosis not present

## 2017-03-19 MED ORDER — CEPHALEXIN 250 MG PO CAPS
500.0000 mg | ORAL_CAPSULE | Freq: Once | ORAL | Status: AC
  Administered 2017-03-19: 500 mg via ORAL
  Filled 2017-03-19: qty 2

## 2017-03-19 MED ORDER — LIDOCAINE-EPINEPHRINE-TETRACAINE (LET) SOLUTION
3.0000 mL | Freq: Once | NASAL | Status: AC
  Administered 2017-03-19: 3 mL via TOPICAL
  Filled 2017-03-19: qty 3

## 2017-03-19 MED ORDER — CEPHALEXIN 500 MG PO CAPS: 500.0000 mg | ORAL_CAPSULE | Freq: Four times a day (QID) | ORAL | 0 refills | Status: DC

## 2017-03-19 NOTE — ED Triage Notes (Signed)
Pt was lifting a wooden barricade off of his hot water heater two night ago. Pt noticed to white dots under the skin; pt attempted to lance area and has had bloody drainage from L ring finger. C/o numbness to finger

## 2017-03-19 NOTE — ED Provider Notes (Signed)
[facility]-EMERGENCY DEPT Provider Note   CSN: [id number] Arrival date & time: 03/19/17  0023    History   Chief Complaint Chief Complaint  Patient presents with  . Finger Injury    HPI Alexander Lamb is a 56 y.o. male.  56 year old male presents to the emergency department for some redness and swelling to the distal aspect of his left ring finger. He reports that he was lifting a wooden barricade off of his hot water heater 2 nights ago. He noticed 2 pustules to the dorsal tip of his finger, around the nailbed, associated with some itching. He has had progressive swelling and erythema with a throbbing discomfort. He tried to lance the areas prior to arrival and had clear drainage with a small amount of lance. He feels as though this relieved some of the pressure. He has not had any associated fever. No trauma or injury to the digit. He also notes subjective decreased sensation which has improved since home I&D.   The history is provided by the patient. No language interpreter was used.    Past Medical History:  Diagnosis Date  . Allergy   . Arthritis   . Carotid stenosis    a.  Carotid US (05/2013): Bilateral 40-59% ICA stenosis (F/u 1 year);  b.  Carotid US (11/15):  No ICA stenosis  . Hx of echocardiogram    Echo (11/14):  EF 55-60%, normal LV thickness, Gr 1 DD, mild LAE  . Hyperlipidemia   . Hypertension     Patient Active Problem List   Diagnosis Date Noted  . HTN (hypertension) 06/09/2013  . Hyperlipidemia, mixed 01/01/2013    Past Surgical History:  Procedure Laterality Date  . TONSILLECTOMY         Home Medications    Prior to Admission medications   Medication Sig Start Date End Date Taking? Authorizing Provider  cephALEXin (KEFLEX) 500 MG capsule Take 1 capsule (500 mg total) by mouth 4 (four) times daily. 03/19/17   Antony Madura, PA-C  CIALIS 5 MG tablet Take 5 mg by mouth daily as needed. Reported on 09/16/2015 01/10/15   [provider]    clomiPHENE (CLOMID) 50 MG tablet Take by mouth daily.    [provider]  lisinopril (PRINIVIL,ZESTRIL) 10 MG tablet TAKE 1 TABLET BY MOUTH EVERY DAY 02/12/17   Tonny Bollman, MD  ondansetron (ZOFRAN ODT) 4 MG disintegrating tablet Take 1 tablet (4 mg total) by mouth every 8 (eight) hours as needed for nausea or vomiting. 10/12/16   Dorena Bodo, NP  rosuvastatin (CRESTOR) 10 MG tablet Take 1 tablet (10 mg total) by mouth daily. 03/15/16   Tonny Bollman, MD    Family History Family History  Problem Relation Age of Onset  . Arthritis Mother   . COPD Mother   . Fibromyalgia Mother   . Cancer Maternal Grandmother     Social History Social History  Substance Use Topics  . Smoking status: Never Smoker  . Smokeless tobacco: Never Used  . Alcohol use 0.0 oz/week    1 - 2 Standard drinks or equivalent per week     Allergies   Sulfa antibiotics   Review of Systems Review of Systems Ten systems reviewed and are negative for acute change, except as noted in the HPI.    Physical Exam Updated Vital Signs BP (!) 144/77   Pulse 83   Temp 98.2 F (36.8 C) (Oral)   Resp 16   Ht  (1.702 m)  Wt 97.5 kg (215 lb)   SpO2 98%   BMI 33.67 kg/m   Physical Exam  Constitutional: He is oriented to person, place, and time. He appears well-developed and well-nourished. No distress.  Nontoxic and in NAD  HENT:  Head: Normocephalic and atraumatic.  Eyes: Conjunctivae and EOM are normal. No scleral icterus.  Neck: Normal range of motion.  Cardiovascular: Normal rate, regular rhythm and intact distal pulses.   Capillary refill brisk in the distal L ring finger  Pulmonary/Chest: Effort normal. No respiratory distress.  Respirations even and unlabored  Musculoskeletal:       Left hand: He exhibits tenderness and swelling. He exhibits normal range of motion and normal capillary refill. Normal sensation noted.       Hands: Neurological: He is alert and oriented to  person, place, and time. He exhibits normal muscle tone. Coordination normal.  Sensation to light touch intact along the radial and ulnar aspect of all digits of the L hand.  Skin: Skin is warm and dry. No rash noted. He is not diaphoretic. No erythema. No pallor.  Psychiatric: He has a normal mood and affect. His behavior is normal.  Nursing note and vitals reviewed.        ED Treatments / Results  Labs (all labs ordered are listed, but only abnormal results are displayed) Labs Reviewed - No data to display  EKG  EKG Interpretation None       Radiology No results found.  Procedures Procedures (including critical care time)  Medications Ordered in ED Medications  cephALEXin (KEFLEX) capsule 500 mg (not administered)  lidocaine-EPINEPHrine-tetracaine (LET) solution (3 mLs Topical Given 03/19/17 0315)     Initial Impression / Assessment and Plan / ED Course  I have reviewed the triage vital signs and the nursing notes.  Pertinent labs & imaging results that were available during my care of the patient were reviewed by me and considered in my medical decision making (see chart for details).     56 year old male presents to the emergency department for evaluation of redness and swelling to the tip of his left ring finger. Patient neurovascularly intact. Physical exam findings suggestive of herpetic whitlow. There was initially question of secondary infection and paronychia, but no purulence was noted with lifting of the base of the nailbed. Plan to cover for secondary infection from skin flora with Keflex. Supportive management also advised and patient reassured on self limiting nature of herpetic whitlow. I have advised PCP follow up for wound recheck. Return precautions discussed and provided. Patient discharged in stable condition with no unaddressed concerns.   Final Clinical Impressions(s) / ED Diagnoses   Final diagnoses:  Herpetic whitlow    New  Prescriptions New Prescriptions   CEPHALEXIN (KEFLEX) 500 MG CAPSULE    Take 1 capsule (500 mg total) by mouth 4 (four) times daily.     Antony Madura, PA-C 03/19/17 0424    Zadie Rhine, MD 03/19/17 [phone]

## 2017-03-19 NOTE — ED Notes (Signed)
Placed finger in warm water soak

## 2017-03-29 ENCOUNTER — Encounter: Payer: Self-pay | Admitting: Cardiovascular Disease

## 2017-04-06 DIAGNOSIS — M9901 Segmental and somatic dysfunction of cervical region: Secondary | ICD-10-CM | POA: Diagnosis not present

## 2017-04-06 DIAGNOSIS — M9908 Segmental and somatic dysfunction of rib cage: Secondary | ICD-10-CM | POA: Diagnosis not present

## 2017-04-06 DIAGNOSIS — M791 Myalgia, unspecified site: Secondary | ICD-10-CM | POA: Diagnosis not present

## 2017-04-06 DIAGNOSIS — M9902 Segmental and somatic dysfunction of thoracic region: Secondary | ICD-10-CM | POA: Diagnosis not present

## 2017-04-09 ENCOUNTER — Other Ambulatory Visit: Payer: Self-pay | Admitting: Cardiovascular Disease

## 2017-04-10 ENCOUNTER — Other Ambulatory Visit: Payer: Self-pay | Admitting: Cardiovascular Disease

## 2017-04-11 ENCOUNTER — Ambulatory Visit: Payer: BLUE CROSS/BLUE SHIELD | Admitting: Cardiovascular Disease

## 2017-04-21 ENCOUNTER — Other Ambulatory Visit: Payer: Self-pay | Admitting: Cardiovascular Disease

## 2017-06-05 ENCOUNTER — Encounter: Payer: Self-pay | Admitting: Cardiovascular Disease

## 2017-06-05 ENCOUNTER — Ambulatory Visit (INDEPENDENT_AMBULATORY_CARE_PROVIDER_SITE_OTHER): Payer: BLUE CROSS/BLUE SHIELD | Admitting: Cardiovascular Disease

## 2017-06-05 VITALS — BP 156/80 | HR 84 | Ht 67.0 in | Wt 225.0 lb

## 2017-06-05 DIAGNOSIS — I10 Essential (primary) hypertension: Secondary | ICD-10-CM

## 2017-06-05 DIAGNOSIS — E782 Mixed hyperlipidemia: Secondary | ICD-10-CM

## 2017-06-05 MED ORDER — LISINOPRIL 20 MG PO TABS: 20.0000 mg | ORAL_TABLET | Freq: Every day | ORAL | 3 refills | Status: DC

## 2017-06-05 NOTE — Patient Instructions (Signed)
Medication Instructions:  1) INCREASE LISINOPRIL to 20 mg daily  Labwork: Your provider recommends that you return for FASTING lab work.    Testing/Procedures: We will call you to discuss appropriate imaging after Dr. Excell Seltzerooper hears from neurology.  Follow-Up: Your provider wants you to follow-up in: 1 year with Dr. Excell Seltzerooper. You will receive a reminder letter in the mail two months in advance. If you don't receive a letter, please call our office to schedule the follow-up appointment.    Any Other Special Instructions Will Be Listed Below (If Applicable).     If you need a refill on your cardiac medications before your next appointment, please call your pharmacy.

## 2017-06-05 NOTE — Progress Notes (Signed)
Cardiology Office Note Date:  06/05/2017   ID:  Alexander Critchleydward D Erhard, DOB 06/07/1961, MRN [id number]  PCP:  Peyton NajjarHopper, David H, MD  Cardiologist:  Tonny BollmanMichael Larya Charpentier, MD    Chief Complaint  Patient presents with  . Headache     History of Present Illness: Alexander Lamb is a 56 y.o. male who presents for follow-up of hypertension and hyperlipidemia.  He hasn't been feeling well. Stopped drinking energy drinks 2 weeks ago and had a period of 'withdrawal' where he felt like he was 'crawling out his skin.'   He has monitored his blood pressure and it has been elevated.  He denies chest pain or shortness of breath.  He does complain of headaches especially occurring after he exercises.  Complains of a generalized pressure-like sensation. No visual changes.  No nausea or vomiting.  No orthopnea, PND, or heart palpitations.   Past Medical History:  Diagnosis Date  . Allergy   . Arthritis   . Carotid stenosis    a.  Carotid US (05/2013): Bilateral 40-59% ICA stenosis (F/u 1 year);  b.  Carotid US (11/15):  No ICA stenosis  . Hx of echocardiogram    Echo (11/14):  EF 55-60%, normal LV thickness, Gr 1 DD, mild LAE  . Hyperlipidemia   . Hypertension     Past Surgical History:  Procedure Laterality Date  . TONSILLECTOMY      Current Outpatient Medications  Medication Sig Dispense Refill  . cephALEXin (KEFLEX) 500 MG capsule Take 1 capsule (500 mg total) by mouth 4 (four) times daily. 20 capsule 0  . CIALIS 5 MG tablet Take 5 mg by mouth daily as needed. Reported on 09/16/2015  5  . clomiPHENE (CLOMID) 50 MG tablet Take by mouth daily.    Marland Kitchen. lisinopril (PRINIVIL,ZESTRIL) 20 MG tablet Take 1 tablet (20 mg total) by mouth daily. 90 tablet 3  . ondansetron (ZOFRAN ODT) 4 MG disintegrating tablet Take 1 tablet (4 mg total) by mouth every 8 (eight) hours as needed for nausea or vomiting. 20 tablet 0  . rosuvastatin (CRESTOR) 10 MG tablet TAKE 1 TABLET (10 MG TOTAL) BY MOUTH DAILY. 90 tablet 0   No  current facility-administered medications for this visit.     Allergies:   Sulfa antibiotics   Social History:  The patient  reports that  has never smoked. he has never used smokeless tobacco. He reports that he drinks alcohol. He reports that he does not use drugs.   Family History:  The patient's family history includes Arthritis in his mother; COPD in his mother; Cancer in his maternal grandmother; Fibromyalgia in his mother.    ROS:  Please see the history of present illness.  Otherwise, review of systems is positive for headaches, fatigue.  All other systems are reviewed and negative.    PHYSICAL EXAM: VS:  BP (!) 156/80   Pulse 84   Ht 5\' 7"  (1.702 m)   Wt 225 lb (102.1 kg)   SpO2 98%   BMI 35.24 kg/m  , BMI Body mass index is 35.24 kg/m. GEN: Well nourished, well developed, in no acute distress  HEENT: normal  Neck: no JVD, no masses. Soft bilateral  carotid bruits Cardiac: RRR without murmur or gallop     Respiratory:  clear to auscultation bilaterally, normal work of breathing GI: soft, nontender, nondistended, + BS MS: no deformity or atrophy  Ext: no pretibial edema, pedal pulses 2+= bilaterally Skin: warm and dry, no rash Neuro:  Strength and sensation are intact Psych: euthymic mood, full affect  EKG:  EKG is ordered today. The ekg ordered today shows NSR 84 bpm, left axis deviation, otherwise normal  Recent Labs: No results found for requested labs within last 8760 hours.   Lipid Panel     Component Value Date/Time   CHOL 123 03/10/2015 0930   TRIG 125.0 03/10/2015 0930   HDL 40.40 03/10/2015 0930   CHOLHDL 3 03/10/2015 0930   VLDL 25.0 03/10/2015 0930   LDLCALC 58 03/10/2015 0930   LDLDIRECT 140.0 05/13/2013 1306      Wt Readings from Last 3 Encounters:  06/05/17 225 lb (102.1 kg)  03/19/17 215 lb (97.5 kg)  03/15/16 225 lb (102.1 kg)     ASSESSMENT AND PLAN: 1.  Hyperlipidemia: Treated with Crestor 10 mg daily.  Last lipids from 2016  reviewed with LDL cholesterol 58.  Discussed lifestyle modification.  Will update a lipid panel and LFTs.  2.  Hypertension, uncontrolled: Recommend increase lisinopril to 20 mg daily.  Follow blood pressure at home. Check labs.  3.  Headache: Patient with family history of cerebral aneurysm.  Will touch base with neurology to determine best imaging study, suspect we will proceed with a CT angiogram of the brain.  Current medicines are reviewed with the patient today.  The patient does not have concerns regarding medicines.  Labs/ tests ordered today include:   Orders Placed This Encounter  Procedures  . EKG 12-Lead    Disposition:   FU one year  Signed, Tonny BollmanMichael Lowen Barringer, MD  06/05/2017 3:37 PM    [facility] [address], [place], [place]  [address] Phone: [phone]; Fax: [phone]

## 2017-06-06 ENCOUNTER — Telehealth: Payer: Self-pay

## 2017-06-06 DIAGNOSIS — R51 Headache: Principal | ICD-10-CM

## 2017-06-06 DIAGNOSIS — R519 Headache, unspecified: Secondary | ICD-10-CM

## 2017-06-06 NOTE — Telephone Encounter (Signed)
Per Dr. Earmon Phoenixooper's note yesterday: "3.  Headache: Patient with family history of cerebral aneurysm.  Will touch base with neurology to determine best imaging study, suspect we will proceed with a CT angiogram of the brain."  Dr. Excell Seltzerooper spoke with Dr. Pearlean BrownieSethi, who recommended proceeding with brain CTA.  Called patient to inform him appropriate test has been ordered and he will soon hear from scheduling to arrange appointment. Per DPR form, left detailed message relaying information. Instructed him to call with questions or concerns.

## 2017-06-07 NOTE — Addendum Note (Signed)
Addended by: Gunnar FusiKEMP, Teiana Hajduk A on: 06/07/2017 10:48 AM   Modules accepted: Orders

## 2017-06-12 NOTE — Telephone Encounter (Signed)
CTA of the brain has been scheduled 12/18.

## 2017-06-18 ENCOUNTER — Other Ambulatory Visit: Payer: Self-pay

## 2017-06-18 ENCOUNTER — Ambulatory Visit (INDEPENDENT_AMBULATORY_CARE_PROVIDER_SITE_OTHER)
Admission: RE | Admit: 2017-06-18 | Discharge: 2017-06-18 | Disposition: A | Discharge status: Home / Self Care | Payer: BLUE CROSS/BLUE SHIELD | Source: Ambulatory Visit | Attending: Cardiovascular Disease | Admitting: Cardiovascular Disease

## 2017-06-18 DIAGNOSIS — R519 Headache, unspecified: Secondary | ICD-10-CM

## 2017-06-18 DIAGNOSIS — R51 Headache: Secondary | ICD-10-CM | POA: Diagnosis not present

## 2017-06-18 DIAGNOSIS — I10 Essential (primary) hypertension: Secondary | ICD-10-CM

## 2017-06-18 DIAGNOSIS — E782 Mixed hyperlipidemia: Secondary | ICD-10-CM

## 2017-06-18 MED ORDER — IOPAMIDOL (ISOVUE-370) INJECTION 76%
80.0000 mL | Freq: Once | INTRAVENOUS | Status: AC | PRN
  Administered 2017-06-18: 80 mL via INTRAVENOUS

## 2017-06-21 ENCOUNTER — Encounter (INDEPENDENT_AMBULATORY_CARE_PROVIDER_SITE_OTHER): Payer: Self-pay

## 2017-06-21 ENCOUNTER — Other Ambulatory Visit: Payer: BLUE CROSS/BLUE SHIELD | Admitting: *Deleted

## 2017-06-21 DIAGNOSIS — I10 Essential (primary) hypertension: Secondary | ICD-10-CM

## 2017-06-21 DIAGNOSIS — E782 Mixed hyperlipidemia: Secondary | ICD-10-CM | POA: Diagnosis not present

## 2017-06-21 LAB — LIPID PANEL
Chol/HDL Ratio: 2.9 ratio (ref 0.0–5.0)
Cholesterol, Total: 128 mg/dL (ref 100–199)
HDL: 44 mg/dL (ref >39)
LDL Calculated: 64 mg/dL (ref 0–99)
Triglycerides: 100 mg/dL (ref 0–149)
VLDL Cholesterol Cal: 20 mg/dL (ref 5–40)

## 2017-06-21 LAB — COMPREHENSIVE METABOLIC PANEL
ALT: 24 IU/L (ref 0–44)
AST: 25 IU/L (ref 0–40)
Albumin/Globulin Ratio: 1.8 (ref 1.2–2.2)
Albumin: 4.2 g/dL (ref 3.5–5.5)
Alkaline Phosphatase: 63 IU/L (ref 39–117)
BUN/Creatinine Ratio: 14 (ref 9–20)
BUN: 15 mg/dL (ref 6–24)
Bilirubin Total: 0.5 mg/dL (ref 0.0–1.2)
CO2: 24 mmol/L (ref 20–29)
Calcium: 8.8 mg/dL (ref 8.7–10.2)
Chloride: 104 mmol/L (ref 96–106)
Creatinine, Ser: 1.09 mg/dL (ref 0.76–1.27)
GFR calc Af Amer: 87 mL/min/{1.73_m2} (ref >59)
GFR calc non Af Amer: 75 mL/min/{1.73_m2} (ref >59)
Globulin, Total: 2.3 g/dL (ref 1.5–4.5)
Glucose: 106 mg/dL — ABNORMAL HIGH (ref 65–99)
Potassium: 4 mmol/L (ref 3.5–5.2)
Sodium: 140 mmol/L (ref 134–144)
Total Protein: 6.5 g/dL (ref 6.0–8.5)

## 2017-07-05 ENCOUNTER — Other Ambulatory Visit: Payer: Self-pay | Admitting: Cardiovascular Disease

## 2017-07-15 ENCOUNTER — Telehealth: Payer: Self-pay

## 2017-07-15 DIAGNOSIS — R930 Abnormal findings on diagnostic imaging of skull and head, not elsewhere classified: Secondary | ICD-10-CM

## 2017-07-15 NOTE — Telephone Encounter (Signed)
Repeat CTA ordered to be scheduled in 1 year. Patient agrees with treatment plan.

## 2017-07-15 NOTE — Telephone Encounter (Signed)
-----   Message from Tonny BollmanMichael Cooper, MD sent at 06/30/2017  1:14 PM EST ----- Regarding: RE: Abnormal CTA Brain Thanks so much Jindong. Hope you have a Happy New Year!  Orpha BurKaty - can you advise repeat CTA in one year?  thx ----- Message ----- From: Marvel PlanXu, Jindong, MD Sent: 06/28/2017   6:05 PM To: Tonny BollmanMichael Cooper, MD Subject: RE: Abnormal CTA Brain                         Hi, Kathlene NovemberMike, sorry for replying late. No need to worry the 2mm outpouching structure, even it is aneurysm, no treatment needed at this time. To be cautions, may be we can repeat CTA in one year to monitor the structure. Otherwise, no worries. - Jindong  ----- Message ----- From: Tonny Bollmanooper, Michael, MD Sent: 06/27/2017  11:19 AM To: Marvel PlanJindong Xu, MD, Henrietta DineKathryn A Kemp, RN Subject: Abnormal CTA Brain                             Jindong - I'm sorry to bother you with this, but as a non-neurologist I'm not sure whether any specific FU is needed. I ordered a CTA of the brain in this patient with headaches and a family hx of cerebral of cerebral aneurysm. There is a 2mm outpouching favoring infundibulum over aneurysm, but a comment to 'consider follow-up' in this patient. Is this someone I should refer to neurology, reassure, or order some type of imaging follow-up.   thx so much - Kathlene NovemberMike

## 2017-10-02 DIAGNOSIS — E291 Testicular hypofunction: Secondary | ICD-10-CM | POA: Diagnosis not present

## 2017-10-02 DIAGNOSIS — N401 Enlarged prostate with lower urinary tract symptoms: Secondary | ICD-10-CM | POA: Diagnosis not present

## 2017-10-02 DIAGNOSIS — N5201 Erectile dysfunction due to arterial insufficiency: Secondary | ICD-10-CM | POA: Diagnosis not present

## 2017-10-02 DIAGNOSIS — R351 Nocturia: Secondary | ICD-10-CM | POA: Diagnosis not present

## 2017-11-05 DIAGNOSIS — R351 Nocturia: Secondary | ICD-10-CM | POA: Diagnosis not present

## 2017-11-05 DIAGNOSIS — N401 Enlarged prostate with lower urinary tract symptoms: Secondary | ICD-10-CM | POA: Diagnosis not present

## 2017-12-09 DIAGNOSIS — H40003 Preglaucoma, unspecified, bilateral: Secondary | ICD-10-CM | POA: Diagnosis not present

## 2017-12-09 DIAGNOSIS — H25813 Combined forms of age-related cataract, bilateral: Secondary | ICD-10-CM | POA: Diagnosis not present

## 2017-12-09 DIAGNOSIS — H527 Unspecified disorder of refraction: Secondary | ICD-10-CM | POA: Diagnosis not present

## 2017-12-09 DIAGNOSIS — H52222 Regular astigmatism, left eye: Secondary | ICD-10-CM | POA: Diagnosis not present

## 2017-12-09 DIAGNOSIS — H5203 Hypermetropia, bilateral: Secondary | ICD-10-CM | POA: Diagnosis not present

## 2018-01-24 ENCOUNTER — Encounter (HOSPITAL_COMMUNITY): Payer: Self-pay

## 2018-01-24 ENCOUNTER — Ambulatory Visit (HOSPITAL_COMMUNITY)
Admission: EM | Admit: 2018-01-24 | Discharge: 2018-01-24 | Disposition: A | Discharge status: Home / Self Care | Payer: BLUE CROSS/BLUE SHIELD | Attending: Emergency Medicine | Admitting: Emergency Medicine

## 2018-01-24 DIAGNOSIS — M545 Low back pain, unspecified: Secondary | ICD-10-CM

## 2018-01-24 MED ORDER — METHOCARBAMOL 750 MG PO TABS: 750.0000 mg | ORAL_TABLET | ORAL | 0 refills | Status: DC

## 2018-01-24 MED ORDER — NAPROXEN 500 MG PO TABS: 500.0000 mg | ORAL_TABLET | Freq: Two times a day (BID) | ORAL | 0 refills | Status: DC

## 2018-01-24 NOTE — Discharge Instructions (Addendum)
Take the Naprosyn with 1 g of Tylenol on a regular basis for the next 5-10 days.  many people find gentle stretching and deep tissue massage helpful. Try [facility] on [address]. They have very reasonable prices and take walk ins. Or you can go to  [facility]. Follow-up with your primary care physician in several days, go to the ER for the signs and symptoms we discussed.  Go to www.goodrx.com to look up your medications. This will give you a list of where you can find your prescriptions at the most affordable prices. Or ask the pharmacist what the cash price is, or if they have any other discount programs available to help make your medication more affordable. This can be less expensive than what you would pay with insurance.

## 2018-01-24 NOTE — ED Provider Notes (Signed)
HPI  SUBJECTIVE:  Alexander Lamb is a 57 y.o. male who presents with seconds along right low back pain for the past 2 days.  He states that it is barely noticeable with sitting, but becomes sharp stabbing and worse with movement, bending forward, leaning to the right and torso rotation.  He tried Naprosyn twice with improvement in symptoms.  He also tried Excedrin Migraine.  Symptoms are worse with bending forward, torso rotation and leaning to the right and flexing his right hip. Sleeps on soft pillowtop mattress on his side.  His back pain was worse this am when getting up.  Recent heavy lifting while supine-was bench pressing weights-  10 days ago.  Said he felt a twinge in his back, but does not remember the location of it. denies N/V, fevers, flank pain, abdominal pain, urinary urgency, frequency, dysuria, cloudy or odorous urine, hematuria.  No syncope. No saddle anesthesia, distal weakness/numbness, bilateral radicular leg pain/weakness, fevers/night sweats, mild trauma > 50, recent h/o trauma,   bladder/ bowel incontinence, urinary retention, h/o CA / multiple myleoma, unexplained weight loss, pain worse at night,  h/o prolonged steroid use, h/o osteopenia, h/o IVDU, h/o HIV, known AAA.   no h/o pyelonephritis, nephrolithiasis. States feels similar to previous episodes of back pain. PMH: UTI, Stress fx L5, left sciatica. PMD: none   Past Medical History:  Diagnosis Date  . Allergy   . Arthritis   . Carotid stenosis    a.  Carotid US (05/2013): Bilateral 40-59% ICA stenosis (F/u 1 year);  b.  Carotid US (11/15):  No ICA stenosis  . Hx of echocardiogram    Echo (11/14):  EF 55-60%, normal LV thickness, Gr 1 DD, mild LAE  . Hyperlipidemia   . Hypertension     Past Surgical History:  Procedure Laterality Date  . TONSILLECTOMY      Family History  Problem Relation Age of Onset  . Arthritis Mother   . COPD Mother   . Fibromyalgia Mother   . Cancer Maternal Grandmother     Social  History   Tobacco Use  . Smoking status: Never Smoker  . Smokeless tobacco: Never Used  Substance Use Topics  . Alcohol use: Yes    Alcohol/week: 0.6 - 1.2 oz    Types: 1 - 2 Standard drinks or equivalent per week  . Drug use: No    No current facility-administered medications for this encounter.   Current Outpatient Medications:  .  CIALIS 5 MG tablet, Take 5 mg by mouth daily as needed. Reported on 09/16/2015, Disp: , Rfl: 5 .  clomiPHENE (CLOMID) 50 MG tablet, Take by mouth daily., Disp: , Rfl:  .  lisinopril (PRINIVIL,ZESTRIL) 20 MG tablet, Take 1 tablet (20 mg total) by mouth daily., Disp: 90 tablet, Rfl: 3 .  rosuvastatin (CRESTOR) 10 MG tablet, TAKE 1 TABLET BY MOUTH EVERY DAY, Disp: 90 tablet, Rfl: 3 .  tamsulosin (FLOMAX) 0.4 MG CAPS capsule, Take 0.4 mg by mouth., Disp: , Rfl:  .  methocarbamol (ROBAXIN) 750 MG tablet, Take 1 tablet (750 mg total) by mouth every 4 (four) hours., Disp: 40 tablet, Rfl: 0 .  naproxen (NAPROSYN) 500 MG tablet, Take 1 tablet (500 mg total) by mouth 2 (two) times daily., Disp: 20 tablet, Rfl: 0  Allergies  Allergen Reactions  . Sulfa Antibiotics     High fever, chills , shortness of breath     ROS  As noted in HPI.   Physical Exam  BP Marland Kitchen(!)  151/75 (BP Location: Right Arm)   Pulse 89   Temp 98.4 F (36.9 C) (Oral)   Resp 18   SpO2 98%   Constitutional: Well developed, well nourished, no acute distress Eyes:  EOMI, conjunctiva normal bilaterally HENT: Normocephalic, atraumatic,mucus membranes moist Respiratory: Normal inspiratory effort Cardiovascular: Normal rate GI: nondistended. No suprapubic tenderness skin: No rash, skin intact Musculoskeletal: no CVAT. + R paralumbar tenderness, -appreciable muscle spasm. No bony tenderness.  No SI joint tenderness.  Bilateral lower extremities nontender, baseline ROM with intact PT pulses,  No pain with int/ext rotation extension hips bilaterally.  Pain aggravated with right hip flexion against  resistance.  SLR neg bilaterally. Sensation baseline light touch bilaterally for Pt, DTR's symmetric and intact bilaterally KJ, Motor symmetric bilateral 5/5 hip flexion, quadriceps, hamstrings, EHL, foot dorsiflexion, foot plantarflexion, gait normal.   Neurologic: Alert & oriented x 3, no focal neuro deficits Psychiatric: Speech and behavior appropriate   ED Course   Medications - No data to display  No orders of the defined types were placed in this encounter.   No results found for this or any previous visit (from the past 24 hour(s)). No results found.  ED Clinical Impression  Acute right-sided low back pain without sciatica   ED Assessment/Plan  No historical evidence of uti, nephrolithiasis.   No evidence of spinal cord involvement based on H&P. Pt describing typical back pain, has been < 6 week duration. No historical red flags as noted in HPI. No physical red flags such as fever, bony tenderness, lower extremity weakness, saddle anesthesia. Imaging not indicated at this time.   Suspect that his back pain is coming from the soft mattress that he is sleeping on.  Will try Naprosyn 500 mg p.o. twice daily with 1 g of Tylenol, Robaxin.  Advised deep tissue massage.  Patient declined prescription for opiates.  He has no nerve pain so do not think that steroids would be useful at this point in time.  Follow-up with his PMD as needed.  Discussedmedical decision-making, and plan for follow-up with the patient.  Discussed signs and symptoms that should prompt return to the emergency department.  Patient agrees with plan.    Meds ordered this encounter  Medications  . naproxen (NAPROSYN) 500 MG tablet    Sig: Take 1 tablet (500 mg total) by mouth 2 (two) times daily.    Dispense:  20 tablet    Refill:  0  . methocarbamol (ROBAXIN) 750 MG tablet    Sig: Take 1 tablet (750 mg total) by mouth every 4 (four) hours.    Dispense:  40 tablet    Refill:  0    *This clinic note was  created using Scientist, clinical (histocompatibility and immunogenetics). Therefore, there may be occasional mistakes despite careful proofreading.  ?    Domenick Gong, MD 01/24/18 1343

## 2018-01-24 NOTE — ED Triage Notes (Signed)
Pt presents with right lower back pain since this am. Reports lifting something heavy 10 days ago and does not think it is related.

## 2018-04-03 DIAGNOSIS — R351 Nocturia: Secondary | ICD-10-CM | POA: Diagnosis not present

## 2018-04-03 DIAGNOSIS — E291 Testicular hypofunction: Secondary | ICD-10-CM | POA: Diagnosis not present

## 2018-04-03 DIAGNOSIS — N401 Enlarged prostate with lower urinary tract symptoms: Secondary | ICD-10-CM | POA: Diagnosis not present

## 2018-04-08 DIAGNOSIS — N401 Enlarged prostate with lower urinary tract symptoms: Secondary | ICD-10-CM | POA: Diagnosis not present

## 2018-04-08 DIAGNOSIS — R351 Nocturia: Secondary | ICD-10-CM | POA: Diagnosis not present

## 2018-04-08 DIAGNOSIS — E291 Testicular hypofunction: Secondary | ICD-10-CM | POA: Diagnosis not present

## 2018-04-13 ENCOUNTER — Other Ambulatory Visit: Payer: Self-pay | Admitting: Cardiovascular Disease

## 2018-04-21 ENCOUNTER — Telehealth: Payer: Self-pay | Admitting: Cardiovascular Disease

## 2018-04-21 NOTE — Telephone Encounter (Signed)
New message    Pt c/o BP issue: STAT if pt c/o blurred vision, one-sided weakness or slurred speech  1. What are your last 5 BP readings? 158/84, 150/80  2. Are you having any other symptoms (ex. Dizziness, headache, blurred vision, passed out)? Pt states he has felt some dizziness, headache   3. What is your BP issue? Pt states his BP has gone up. He requested appt. Scheduled for Nov. 7th @ 230 with Alexander Lamb.

## 2018-04-21 NOTE — Telephone Encounter (Signed)
Left message to call back  

## 2018-04-24 NOTE — Telephone Encounter (Signed)
Left message to call back  

## 2018-05-01 NOTE — Telephone Encounter (Signed)
Left message to call back if assistance is still needed prior to appointment next week.

## 2018-05-08 ENCOUNTER — Encounter: Payer: Self-pay | Admitting: Physician Assistant

## 2018-05-08 ENCOUNTER — Ambulatory Visit (INDEPENDENT_AMBULATORY_CARE_PROVIDER_SITE_OTHER): Payer: BLUE CROSS/BLUE SHIELD | Admitting: Physician Assistant

## 2018-05-08 VITALS — BP 144/88 | HR 78 | Ht 67.0 in | Wt 221.8 lb

## 2018-05-08 DIAGNOSIS — I10 Essential (primary) hypertension: Secondary | ICD-10-CM | POA: Diagnosis not present

## 2018-05-08 DIAGNOSIS — Z79899 Other long term (current) drug therapy: Secondary | ICD-10-CM

## 2018-05-08 DIAGNOSIS — I671 Cerebral aneurysm, nonruptured: Secondary | ICD-10-CM | POA: Diagnosis not present

## 2018-05-08 MED ORDER — LISINOPRIL 40 MG PO TABS: 40.0000 mg | ORAL_TABLET | Freq: Every day | ORAL | 3 refills | Status: DC

## 2018-05-08 NOTE — Patient Instructions (Signed)
Medication Instructions:  1. INCREASE LISINOPRIL TO 40 MG DAILY; NEW RX HAS BEEN SENT IN  If you need a refill on your cardiac medications before your next appointment, please call your pharmacy.   Lab work: BMET TO BE DONE THE 1ST WEEK OF 06/2018 If you have labs (blood work) drawn today and your tests are completely normal, you will receive your results only by: Marland Kitchen MyChart Message (if you have MyChart) OR . A paper copy in the mail If you have any lab test that is abnormal or we need to change your treatment, we will call you to review the results.  Testing/Procedures: NONE ORDERED TODAY  Follow-Up: At [facility], you and your health needs are our priority.  As part of our continuing mission to provide you with exceptional heart care, we have created designated Provider Care Teams.  These Care Teams include your primary Cardiologist (physician) and Advanced Practice Providers (APPs -  Physician Assistants and Nurse Practitioners) who all work together to provide you with the care you need, when you need it. You will need a follow up appointment in:  1 years.  Please call our office 2 months in advance to schedule this appointment.  You may see DR. Excell Seltzer or one of the following Advanced Practice Providers on your designated Care Team: Tereso Newcomer, PA-C [place], [place] . Berton Bon, NP  YOU ARE BEING REFERRED TO THE HYPERTENSION CLINIC; WE WILL TRY TO SCHEDULE AN APPT THE SAME DAY AS YOUR CT ON 06/20/18; IF NOT POSSIBLE THEN WE WILL SCHEDULE YOU WITH PA APP ON DR. Earmon Phoenix TEAM ON 06/20/18  Any Other Special Instructions Will Be Listed Below (If Applicable).

## 2018-05-08 NOTE — Progress Notes (Signed)
Cardiology Office Note    Date:  05/08/2018   ID:  Alexander Lamb, DOB 1961/06/20, MRN [id number]  PCP:  Alexander Najjar, MD  Cardiologist: Dr. Excell Seltzer   Chief Complaint: Elevated BP  History of Present Illness:   Alexander Lamb is a 57 y.o. male brain aneurysm, HTN and HLD presents for elevated BP.   He followed Dr. Excell Seltzer for blood pressure management.Last seen by Dr. Excell Seltzer 06/2017. Noted headache. Family hx of cerebral aneurysm.  Dr. Excell Seltzer spoke with Dr. Pearlean Brownie, who recommended proceeding with brain CTA which showed  IMPRESSION: 1. No explanation for headache. 2. 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  Recommended repeat study in 1 year. Recently noted increased BP to 150/80s.  Now started running. Lost body fat with improved blood pressure. No chest pain, SOB, dizziness, palpitations, orthopnea, PND, syncope or LE edema. No weakness on one side of body or slurred speech.    Past Medical History:  Diagnosis Date  . Allergy   . Arthritis   . Carotid stenosis    a.  Carotid US (05/2013): Bilateral 40-59% ICA stenosis (F/u 1 year);  b.  Carotid US (11/15):  No ICA stenosis  . Hx of echocardiogram    Echo (11/14):  EF 55-60%, normal LV thickness, Gr 1 DD, mild LAE  . Hyperlipidemia   . Hypertension     Past Surgical History:  Procedure Laterality Date  . TONSILLECTOMY      Current Medications: Prior to Admission medications   Medication Sig Start Date End Date Taking? Authorizing Provider  CIALIS 5 MG tablet Take 5 mg by mouth daily as needed. Reported on 09/16/2015 01/10/15   [provider]  clomiPHENE (CLOMID) 50 MG tablet Take by mouth daily.    [provider]  lisinopril (PRINIVIL,ZESTRIL) 20 MG tablet Take 1 tablet (20 mg total) by mouth daily. Please make annual appt with Dr. Excell Seltzer for future refills. [phone] 04/14/18   Tonny Bollman, MD    methocarbamol (ROBAXIN) 750 MG tablet Take 1 tablet (750 mg total) by mouth every 4 (four) hours. 01/24/18   Domenick Gong, MD  naproxen (NAPROSYN) 500 MG tablet Take 1 tablet (500 mg total) by mouth 2 (two) times daily. 01/24/18   Domenick Gong, MD  rosuvastatin (CRESTOR) 10 MG tablet TAKE 1 TABLET BY MOUTH EVERY DAY 07/05/17   Tonny Bollman, MD  tamsulosin (FLOMAX) 0.4 MG CAPS capsule Take 0.4 mg by mouth.    [provider]    Allergies:   Sulfa antibiotics   Social History   Socioeconomic History  . Marital status: Married    Spouse name: Not on file  . Number of children: Not on file  . Years of education: Not on file  . Highest education level: Not on file  Occupational History  . Not on file  Social Needs  . Financial resource strain: Not on file  . Food insecurity:    Worry: Not on file    Inability: Not on file  . Transportation needs:    Medical: Not on file    Non-medical: Not on file  Tobacco Use  . Smoking status: Never Smoker  . Smokeless tobacco: Never Used  Substance and Sexual Activity  . Alcohol use: Yes    Alcohol/week: 1.0 - 2.0 standard drinks    Types: 1 - 2 Standard drinks or equivalent per week  . Drug use: No  .  Sexual activity: Yes    Birth control/protection: None  Lifestyle  . Physical activity:    Days per week: Not on file    Minutes per session: Not on file  . Stress: Not on file  Relationships  . Social connections:    Talks on phone: Not on file    Gets together: Not on file    Attends religious service: Not on file    Active member of club or organization: Not on file    Attends meetings of clubs or organizations: Not on file    Relationship status: Not on file  Other Topics Concern  . Not on file  Social History Narrative  . Not on file     Family History:  The patient's family history includes Arthritis in his mother; COPD in his mother; Cancer in his maternal grandmother; Fibromyalgia in his mother.  ROS:    Please see the history of present illness.    ROS All other systems reviewed and are negative.   PHYSICAL EXAM:   VS:  BP (!) 144/88   Pulse 78   Ht 5\' 7"  (1.702 m)   Wt 221 lb 12.8 oz (100.6 kg)   SpO2 94%   BMI 34.74 kg/m    GEN: Well nourished, well developed, in no acute distress  HEENT: normal  Neck: no JVD, carotid bruits, or masses Cardiac: RRR; no murmurs, rubs, or gallops,no edema  Respiratory:  clear to auscultation bilaterally, normal work of breathing GI: soft, nontender, nondistended, + BS MS: no deformity or atrophy  Skin: warm and dry, no rash Neuro:  Alert and Oriented x 3, Strength and sensation are intact Psych: euthymic mood, full affect  Wt Readings from Last 3 Encounters:  05/08/18 221 lb 12.8 oz (100.6 kg)  06/05/17 225 lb (102.1 kg)  03/19/17 215 lb (97.5 kg)      Studies/Labs Reviewed:   EKG:  EKG is ordered today.  The ekg ordered today demonstrates NSR at rate of 78  Recent Labs: 06/21/2017: ALT 24; BUN 15; Creatinine, Ser 1.09; Potassium 4.0; Sodium 140   Lipid Panel    Component Value Date/Time   CHOL 128 06/21/2017 0818   TRIG 100 06/21/2017 0818   HDL 44 06/21/2017 0818   CHOLHDL 2.9 06/21/2017 0818   CHOLHDL 3 03/10/2015 0930   VLDL 25.0 03/10/2015 0930   LDLCALC 64 06/21/2017 0818   LDLDIRECT 140.0 05/13/2013 1306    Additional studies/ records that were reviewed today include:   As  above    ASSESSMENT & PLAN:    1. HTN - Elevated. Minimally improved after stating exercise. Will increase lasix to 40mg  qd. Check BMET in few weeks with follow up.   2. Cerebral aneurysm - repeat study next month. Asymptomatic except rare headache.   Medication Adjustments/Labs and Tests Ordered: Current medicines are reviewed at length with the patient today.  Concerns regarding medicines are outlined above.  Medication changes, Labs and Tests ordered today are listed in the Patient Instructions below. Patient Instructions  Medication  Instructions:  1. INCREASE LISINOPRIL TO 40 MG DAILY; NEW RX HAS BEEN SENT IN  If you need a refill on your cardiac medications before your next appointment, please call your pharmacy.   Lab work: BMET TO BE DONE THE 1ST WEEK OF 06/2018 If you have labs (blood work) drawn today and your tests are completely normal, you will receive your results only by: Marland Kitchen MyChart Message (if you have MyChart) OR . A paper  copy in the mail If you have any lab test that is abnormal or we need to change your treatment, we will call you to review the results.  Testing/Procedures: NONE ORDERED TODAY  Follow-Up: At [facility], you and your health needs are our priority.  As part of our continuing mission to provide you with exceptional heart care, we have created designated Provider Care Teams.  These Care Teams include your primary Cardiologist (physician) and Advanced Practice Providers (APPs -  Physician Assistants and Nurse Practitioners) who all work together to provide you with the care you need, when you need it. You will need a follow up appointment in:  1 years.  Please call our office 2 months in advance to schedule this appointment.  You may see DR. Excell Seltzer or one of the following Advanced Practice Providers on your designated Care Team: Tereso Newcomer, PA-C [place], [place] . Berton Bon, NP  YOU ARE BEING REFERRED TO THE HYPERTENSION CLINIC; WE WILL TRY TO SCHEDULE AN APPT THE SAME DAY AS YOUR CT ON 06/20/18; IF NOT POSSIBLE THEN WE WILL SCHEDULE YOU WITH PA APP ON DR. Earmon Phoenix TEAM ON 06/20/18  Any Other Special Instructions Will Be Listed Below (If Applicable).       Lorelei Pont, [place]  05/08/2018 3:08 PM    [facility] [address] [place], [place], [place]  [address] Phone: [phone]; Fax: [phone]

## 2018-06-02 ENCOUNTER — Other Ambulatory Visit: Payer: BLUE CROSS/BLUE SHIELD | Admitting: *Deleted

## 2018-06-02 DIAGNOSIS — Z79899 Other long term (current) drug therapy: Secondary | ICD-10-CM

## 2018-06-02 DIAGNOSIS — I10 Essential (primary) hypertension: Secondary | ICD-10-CM

## 2018-06-02 LAB — BASIC METABOLIC PANEL
BUN/Creatinine Ratio: 17 (ref 9–20)
BUN: 19 mg/dL (ref 6–24)
CO2: 23 mmol/L (ref 20–29)
Calcium: 9.2 mg/dL (ref 8.7–10.2)
Chloride: 102 mmol/L (ref 96–106)
Creatinine, Ser: 1.15 mg/dL (ref 0.76–1.27)
GFR calc Af Amer: 81 mL/min/{1.73_m2} (ref >59)
GFR calc non Af Amer: 70 mL/min/{1.73_m2} (ref >59)
Glucose: 96 mg/dL (ref 65–99)
Potassium: 4 mmol/L (ref 3.5–5.2)
Sodium: 143 mmol/L (ref 134–144)

## 2018-06-03 ENCOUNTER — Telehealth: Payer: Self-pay | Admitting: *Deleted

## 2018-06-03 NOTE — Telephone Encounter (Signed)
-----   Message from BeechmontBhavinkumar Bhagat, [place]PA sent at 06/03/2018  8:22 AM EST ----- Normal renal function and electrolytes.

## 2018-06-03 NOTE — Telephone Encounter (Signed)
Called pt re: lab results.  Left a message for him to call back. 

## 2018-06-04 NOTE — Telephone Encounter (Signed)
Sent message via mychart

## 2018-06-20 ENCOUNTER — Encounter: Payer: Self-pay | Admitting: Pharmacist

## 2018-06-20 ENCOUNTER — Inpatient Hospital Stay: Admission: RE | Admit: 2018-06-20 | Payer: BLUE CROSS/BLUE SHIELD | Source: Ambulatory Visit

## 2018-06-20 ENCOUNTER — Ambulatory Visit (INDEPENDENT_AMBULATORY_CARE_PROVIDER_SITE_OTHER): Payer: BLUE CROSS/BLUE SHIELD | Admitting: Pharmacist

## 2018-06-20 ENCOUNTER — Ambulatory Visit (INDEPENDENT_AMBULATORY_CARE_PROVIDER_SITE_OTHER)
Admission: RE | Admit: 2018-06-20 | Discharge: 2018-06-20 | Disposition: A | Discharge status: Home / Self Care | Payer: BLUE CROSS/BLUE SHIELD | Source: Ambulatory Visit | Attending: Cardiovascular Disease | Admitting: Cardiovascular Disease

## 2018-06-20 VITALS — BP 148/80 | HR 75

## 2018-06-20 DIAGNOSIS — R930 Abnormal findings on diagnostic imaging of skull and head, not elsewhere classified: Secondary | ICD-10-CM | POA: Diagnosis not present

## 2018-06-20 DIAGNOSIS — I10 Essential (primary) hypertension: Secondary | ICD-10-CM | POA: Diagnosis not present

## 2018-06-20 DIAGNOSIS — I671 Cerebral aneurysm, nonruptured: Secondary | ICD-10-CM | POA: Diagnosis not present

## 2018-06-20 MED ORDER — IOPAMIDOL (ISOVUE-370) INJECTION 76%
80.0000 mL | Freq: Once | INTRAVENOUS | Status: AC | PRN
  Administered 2018-06-20: 80 mL via INTRAVENOUS

## 2018-06-20 MED ORDER — AMLODIPINE BESYLATE 5 MG PO TABS: 5.0000 mg | ORAL_TABLET | Freq: Every day | ORAL | 3 refills | Status: DC

## 2018-06-20 NOTE — Progress Notes (Signed)
Patient ID: Alexander Lamb                 DOB: 10/10/1960                      MRN: [id number]     HPI: Alexander Lamb is a 57 y.o. male referred by PA Manson PasseyBhavinkumar Bhagat, a patient of Dr. Excell Seltzerooper to HTN clinic. PMH is significant for carotid stenosis, HTN, HLD and grade 1 diastolic dysfunction.   Patient states that he is having very few, very minor headaches. States he has a little dizziness, but more so when he is working hard.  Patient had a lot of questions about exercise, muscle tension, good body fat percentage and stretching.  He states is considering starting a stretching routine and was wondering if that would help his BP.  Current HTN meds: lisinopril 40mg  Previously tried: none BP goal: <130/80  Family History: The patient's family history includes Arthritis in his mother; COPD in his mother; Cancer in his maternal grandmother; Fibromyalgia in his mother.  Social History: -tobacco, + ETOH 1-2 glasses wine most nights, - illict drugs  Diet: does not add salt to food  Exercise: weight training-upper body, not much running in the last 3 weeks due to upper respiratory symptoms  Home BP readings: 140's/70's does not check on a regular basis, only really checks when he feels like its high  Wt Readings from Last 3 Encounters:  05/08/18 221 lb 12.8 oz (100.6 kg)  06/05/17 225 lb (102.1 kg)  03/19/17 215 lb (97.5 kg)   BP Readings from Last 3 Encounters:  05/08/18 (!) 144/88  01/24/18 (!) 151/75  06/05/17 (!) 156/80   Pulse Readings from Last 3 Encounters:  05/08/18 78  01/24/18 89  06/05/17 84    Renal function: CrCl cannot be calculated (Unknown ideal weight.).  Past Medical History:  Diagnosis Date  . Allergy   . Arthritis   . Carotid stenosis    a.  Carotid US (05/2013): Bilateral 40-59% ICA stenosis (F/u 1 year);  b.  Carotid US (11/15):  No ICA stenosis  . Hx of echocardiogram    Echo (11/14):  EF 55-60%, normal LV thickness, Gr 1 DD, mild LAE  .  Hyperlipidemia   . Hypertension     Current Outpatient Medications on File Prior to Visit  Medication Sig Dispense Refill  . Aspirin-Acetaminophen-Caffeine (EXCEDRIN PO) Take 1 tablet by mouth as needed.    Marland Kitchen. CIALIS 5 MG tablet Take 5 mg by mouth daily as needed. Reported on 09/16/2015  5  . clomiPHENE (CLOMID) 50 MG tablet Take by mouth daily.    Marland Kitchen. lisinopril (PRINIVIL,ZESTRIL) 40 MG tablet Take 1 tablet (40 mg total) by mouth daily. 90 tablet 3  . methocarbamol (ROBAXIN) 750 MG tablet Take 1 tablet (750 mg total) by mouth every 4 (four) hours. 40 tablet 0  . naproxen (NAPROSYN) 500 MG tablet Take 1 tablet (500 mg total) by mouth 2 (two) times daily. 20 tablet 0  . OMEPRAZOLE PO Take 1 tablet by mouth as needed.    . rosuvastatin (CRESTOR) 10 MG tablet TAKE 1 TABLET BY MOUTH EVERY DAY 90 tablet 3  . tamsulosin (FLOMAX) 0.4 MG CAPS capsule Take 0.4 mg by mouth.     No current facility-administered medications on file prior to visit.     Allergies  Allergen Reactions  . Sulfa Antibiotics     High fever, chills , shortness of breath  There were no vitals taken for this visit.   Assessment/Plan:  1. Hypertension - Patient blood pressure not at goal of <130/80. Discussed options of adding either amlodipine or chlorthalidone. Reviewed common side effects of both and briefly how they work. Patient prefers to start amlodipine. Rx for amlodipine 5mg  daily was sent to pharmacy. Recommended taking in the evening for better BP lowering throughout the day. He already takes a few meds at night and was agreeable to taking in the evening.   Discussed attempting to cut some Na out of his diet and increasing his cardio workouts (ie running).  Follow up in about 1 month for BP check.  Thank you  Olene FlossMelissa D Elmon Shader, Pharm.D, BCPS [facility]  [address], [place], [place] [address]  Phone: [phone]; Fax: [phone]

## 2018-06-20 NOTE — Patient Instructions (Signed)
Start taking amlodipine 5mg  every night.  Call us with any questions or concerns at [phone]

## 2018-07-22 ENCOUNTER — Ambulatory Visit (INDEPENDENT_AMBULATORY_CARE_PROVIDER_SITE_OTHER): Payer: Self-pay | Admitting: Pharmacist

## 2018-07-22 ENCOUNTER — Encounter: Payer: Self-pay | Admitting: Pharmacist

## 2018-07-22 VITALS — BP 120/58 | HR 80

## 2018-07-22 DIAGNOSIS — I10 Essential (primary) hypertension: Secondary | ICD-10-CM

## 2018-07-22 MED ORDER — LISINOPRIL 40 MG PO TABS: 40.0000 mg | ORAL_TABLET | Freq: Every day | ORAL | 3 refills | Status: DC

## 2018-07-22 MED ORDER — AMLODIPINE BESYLATE 5 MG PO TABS: 5.0000 mg | ORAL_TABLET | Freq: Every day | ORAL | 3 refills | Status: DC

## 2018-07-22 NOTE — Patient Instructions (Addendum)
Continue amlodipine 5mg  daily and lisinopril 40mg  daily.  Follow up with Dr. Excell Seltzerooper or his PA in November  Call us at [phone] with any questions for concerns

## 2018-07-22 NOTE — Progress Notes (Signed)
Patient ID: KOL HARDERS                 DOB: 1961/02/10                      MRN: [id number]     HPI: Alexander Lamb is a 58 y.o. male referred by PA Manson Passey, a patient of Dr. Excell Seltzer to HTN clinic. PMH is significant for carotid stenosis, HTN, HLD and grade 1 diastolic dysfunction.   Patient denies dizziness, lightheadedness, headaches, blurred vision or swelling.   Patient has not been doing an cardio during his workouts because of the cold weather, but he is doing weight resistant training.  States that he feels a lot better.   Current HTN meds: lisinopril 40mg , amlodipine 5mg  daily Previously tried: none BP goal: <130/80  Family History: The patient's family history includes Arthritis in his mother; COPD in his mother; Cancer in his maternal grandmother; Fibromyalgia in his mother.  Social History: -tobacco, + ETOH 1-2 glasses wine most nights, - illict drugs  Diet: does not add salt to food  Exercise: weight training-upper body, not much running in the last 3 weeks due to upper respiratory symptoms  Home BP readings: hasn't checked  Wt Readings from Last 3 Encounters:  05/08/18 221 lb 12.8 oz (100.6 kg)  06/05/17 225 lb (102.1 kg)  03/19/17 215 lb (97.5 kg)   BP Readings from Last 3 Encounters:  07/22/18 (!) 120/58  06/20/18 (!) 148/80  05/08/18 (!) 144/88   Pulse Readings from Last 3 Encounters:  07/22/18 80  06/20/18 75  05/08/18 78    Renal function: CrCl cannot be calculated (Patient's most recent lab result is older than the maximum 21 days allowed.).  Past Medical History:  Diagnosis Date  . Allergy   . Arthritis   . Carotid stenosis    a.  Carotid US (05/2013): Bilateral 40-59% ICA stenosis (F/u 1 year);  b.  Carotid US (11/15):  No ICA stenosis  . Hx of echocardiogram    Echo (11/14):  EF 55-60%, normal LV thickness, Gr 1 DD, mild LAE  . Hyperlipidemia   . Hypertension     Current Outpatient Medications on File Prior to Visit    Medication Sig Dispense Refill  . Aspirin-Acetaminophen-Caffeine (EXCEDRIN PO) Take 1 tablet by mouth as needed.    Marland Kitchen CIALIS 5 MG tablet Take 5 mg by mouth daily as needed. Reported on 09/16/2015  5  . clomiPHENE (CLOMID) 50 MG tablet Take by mouth daily.    . methocarbamol (ROBAXIN) 750 MG tablet Take 1 tablet (750 mg total) by mouth every 4 (four) hours. (Patient not taking: Reported on 06/20/2018) 40 tablet 0  . naproxen (NAPROSYN) 500 MG tablet Take 1 tablet (500 mg total) by mouth 2 (two) times daily. (Patient taking differently: Take 500 mg by mouth 2 (two) times daily as needed (joint pain). ) 20 tablet 0  . OMEPRAZOLE PO Take 1 tablet by mouth as needed.    . rosuvastatin (CRESTOR) 10 MG tablet TAKE 1 TABLET BY MOUTH EVERY DAY 90 tablet 3  . tamsulosin (FLOMAX) 0.4 MG CAPS capsule Take 0.4 mg by mouth.     No current facility-administered medications on file prior to visit.     Allergies  Allergen Reactions  . Sulfa Antibiotics     High fever, chills , shortness of breath    Blood pressure (!) 120/58, pulse 80, SpO2 98 %.   Assessment/Plan:  1. Hypertension - Blood pressure is at goal of <130/80. Continue amlodipine 5mg  daily and lisinopril 40mg  daily. Follow up with cardiologist as advised. Patient advised that if his pressure starts to run high or he wants to calibrate his home meter, he can always schedule an appointment with [place] again.   Thank you  Olene Floss, Pharm.D, BCPS [facility]  [address], [place], [place] [address]  Phone: [phone]; Fax: [phone]

## 2018-07-28 ENCOUNTER — Other Ambulatory Visit: Payer: Self-pay | Admitting: Cardiovascular Disease

## 2018-10-04 IMAGING — DX DG CHEST 2V
2 series · 2 of 2 positions shown · non-contrast
Comparison: None.

CLINICAL DATA: Fever, cough, body ache, shortness of Breath

EXAM:
CHEST  2 VIEW

[chest pa]
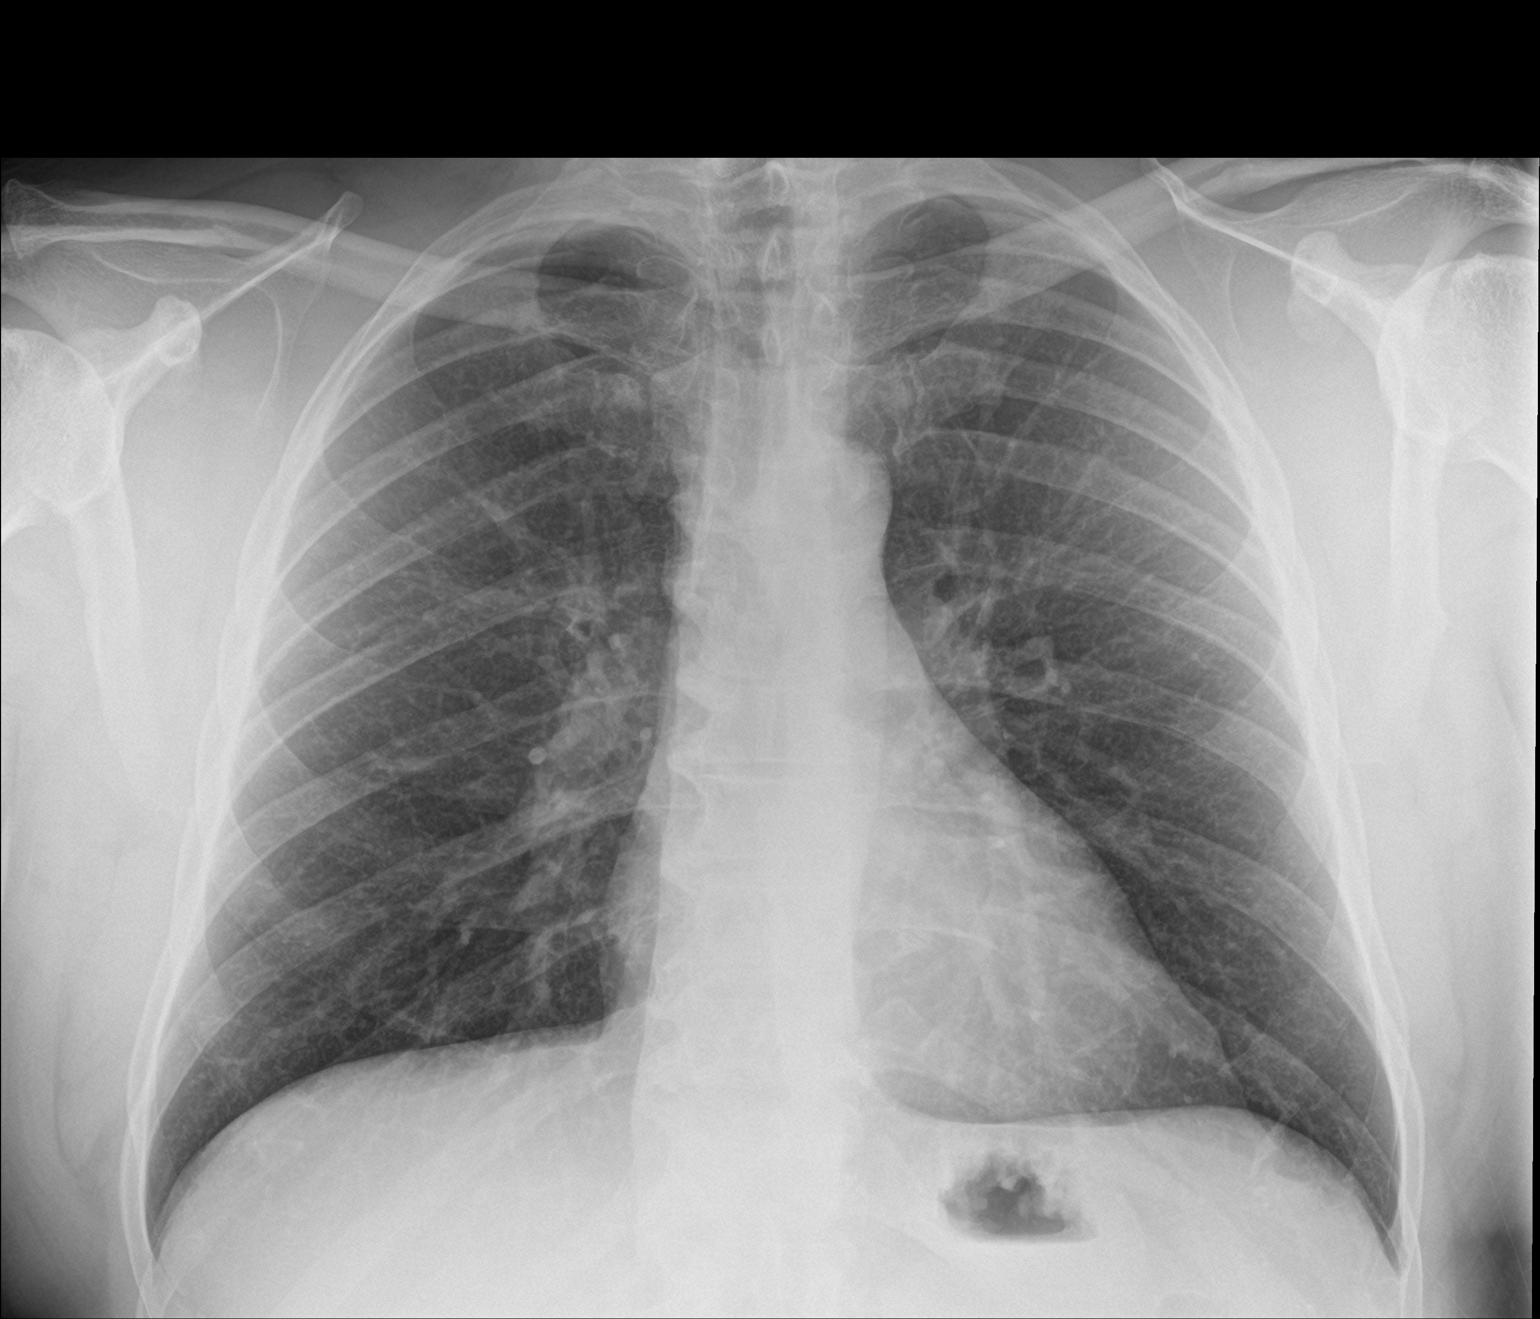

[chest lat]
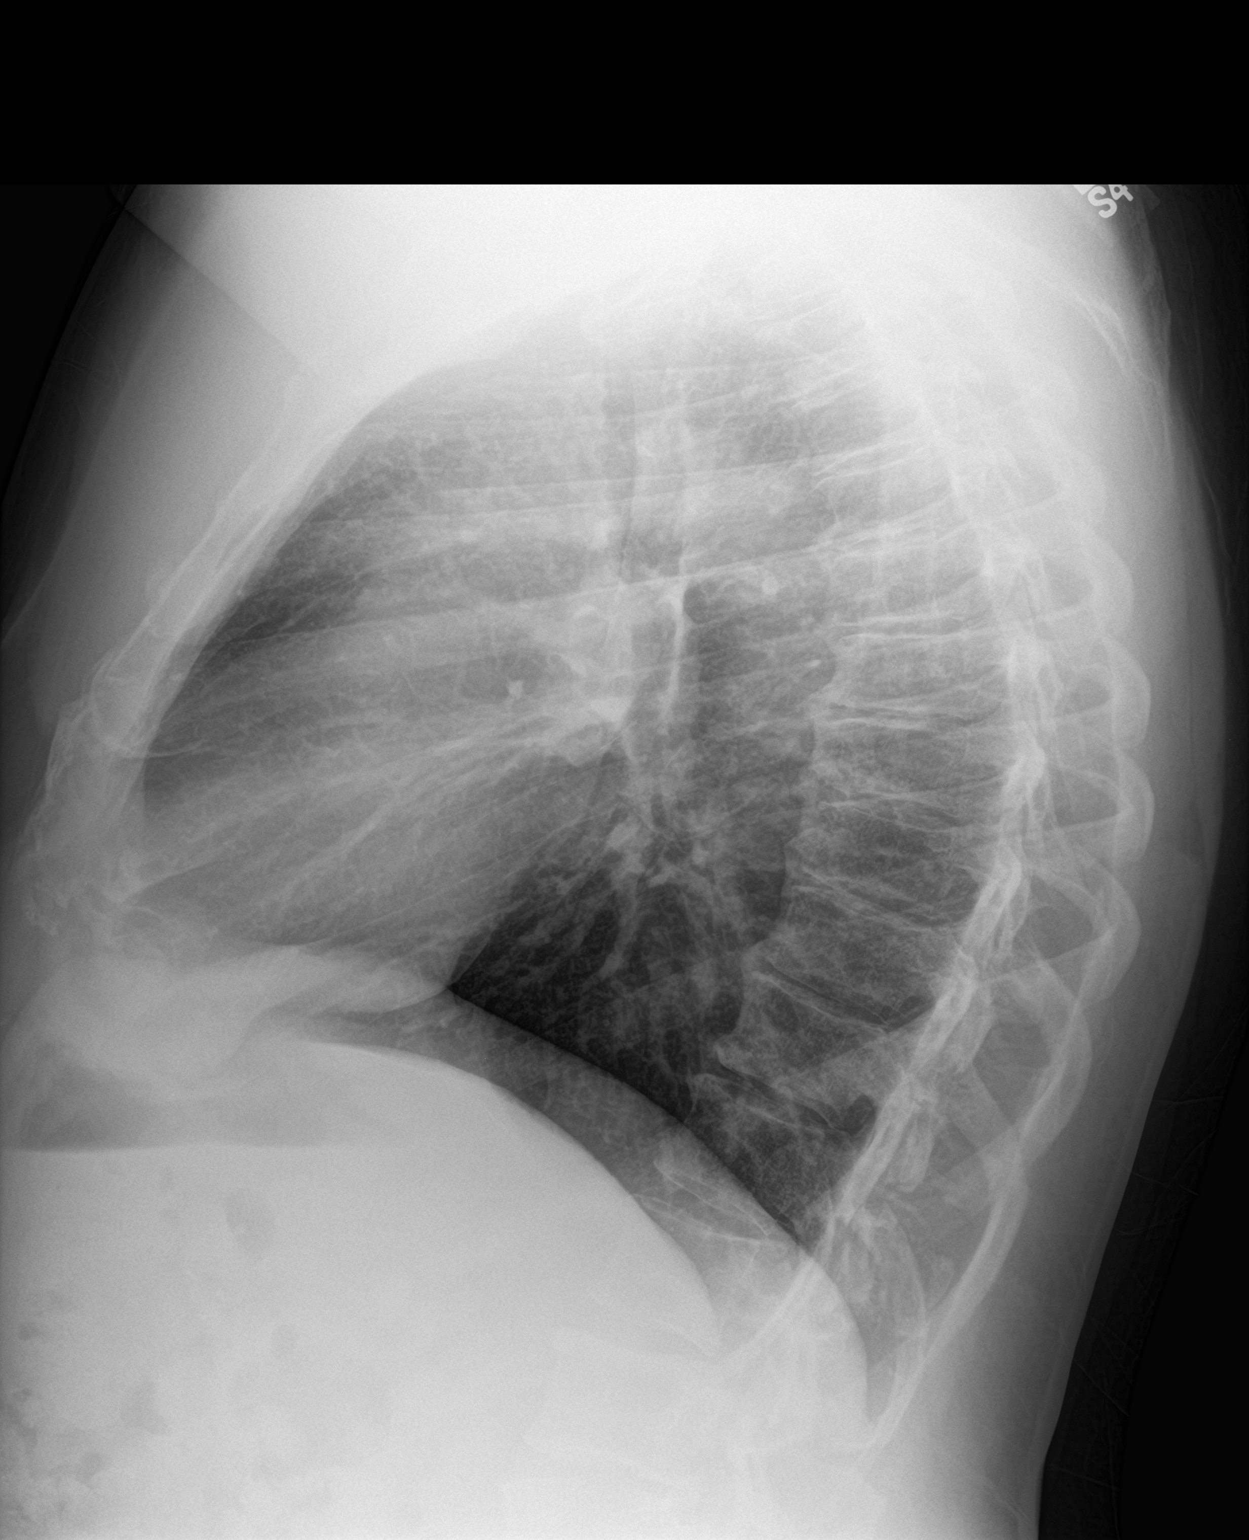

[2 of 2 positions shown; findings below may reference images not displayed]

FINDINGS: Cardiomediastinal silhouette is unremarkable. No infiltrate or
pleural effusion. No pulmonary edema. Mild perihilar bronchitic
changes. Mild degenerative changes mid and lower thoracic spine
IMPRESSION: No infiltrate or pulmonary edema. Mild perihilar bronchitic changes.
Degenerative changes thoracic spine.

## 2019-06-11 DIAGNOSIS — M25512 Pain in left shoulder: Secondary | ICD-10-CM | POA: Diagnosis not present

## 2019-06-11 DIAGNOSIS — M6281 Muscle weakness (generalized): Secondary | ICD-10-CM | POA: Diagnosis not present

## 2019-06-11 DIAGNOSIS — M25612 Stiffness of left shoulder, not elsewhere classified: Secondary | ICD-10-CM | POA: Diagnosis not present

## 2019-06-11 NOTE — Progress Notes (Deleted)
Cardiology Office Note    Date:  06/11/2019   ID:  Alexander Lamb, DOB August 18, 1960, MRN [id number]  PCP:  Peyton Najjar, MD  Cardiologist:  Dr. Excell Seltzer   Chief Complaint: 12  Months follow up  History of Present Illness:   Alexander Lamb is a 58 y.o. male with hx of brain aneurysm, HTN and HLD seen for follow up.   He followed Dr. Excell Seltzer for blood pressure management. Family hx of cerebral aneurysm. He had headache >> followed by brain CTA 06/2017 showed bilateral supraclinoid outpouchings measuring up to 2 mm on the right where conical shape favors infundibulum over aneurysm. This was unchanged on last scan 06/2018.  Here today for follow up.    Past Medical History:  Diagnosis Date  . Allergy   . Arthritis   . Carotid stenosis    a.  Carotid US (05/2013): Bilateral 40-59% ICA stenosis (F/u 1 year);  b.  Carotid US (11/15):  No ICA stenosis  . Hx of echocardiogram    Echo (11/14):  EF 55-60%, normal LV thickness, Gr 1 DD, mild LAE  . Hyperlipidemia   . Hypertension     Past Surgical History:  Procedure Laterality Date  . TONSILLECTOMY      Current Medications: Prior to Admission medications   Medication Sig Start Date End Date Taking? Authorizing Provider  amLODipine (NORVASC) 5 MG tablet Take 1 tablet (5 mg total) by mouth daily. 07/22/18   Tonny Bollman, MD  Aspirin-Acetaminophen-Caffeine (EXCEDRIN PO) Take 1 tablet by mouth as needed.    [provider]  CIALIS 5 MG tablet Take 5 mg by mouth daily as needed. Reported on 09/16/2015 01/10/15   [provider]  clomiPHENE (CLOMID) 50 MG tablet Take by mouth daily.    [provider]  lisinopril (PRINIVIL,ZESTRIL) 40 MG tablet Take 1 tablet (40 mg total) by mouth daily. 07/22/18   Tonny Bollman, MD  methocarbamol (ROBAXIN) 750 MG tablet Take 1 tablet (750 mg total) by mouth every 4 (four) hours. Patient not taking: Reported on 06/20/2018 01/24/18   Domenick Gong, MD  naproxen  (NAPROSYN) 500 MG tablet Take 1 tablet (500 mg total) by mouth 2 (two) times daily. Patient taking differently: Take 500 mg by mouth 2 (two) times daily as needed (joint pain).  01/24/18   Domenick Gong, MD  OMEPRAZOLE PO Take 1 tablet by mouth as needed.    [provider]  rosuvastatin (CRESTOR) 10 MG tablet TAKE 1 TABLET BY MOUTH EVERY DAY 07/28/18   Tonny Bollman, MD  tamsulosin (FLOMAX) 0.4 MG CAPS capsule Take 0.4 mg by mouth.    [provider]    Allergies:   Sulfa antibiotics   Social History   Socioeconomic History  . Marital status: Married    Spouse name: Not on file  . Number of children: Not on file  . Years of education: Not on file  . Highest education level: Not on file  Occupational History  . Not on file  Tobacco Use  . Smoking status: Never Smoker  . Smokeless tobacco: Never Used  Substance and Sexual Activity  . Alcohol use: Yes    Alcohol/week: 1.0 - 2.0 standard drinks    Types: 1 - 2 Standard drinks or equivalent per week  . Drug use: No  . Sexual activity: Yes    Birth control/protection: None  Other Topics Concern  . Not on file  Social History Narrative  . Not on file  Social Determinants of Health   Financial Resource Strain:   . Difficulty of Paying Living Expenses: Not on file  Food Insecurity:   . Worried About Charity fundraiser in the Last Year: Not on file  . Ran Out of Food in the Last Year: Not on file  Transportation Needs:   . Lack of Transportation (Medical): Not on file  . Lack of Transportation (Non-Medical): Not on file  Physical Activity:   . Days of Exercise per Week: Not on file  . Minutes of Exercise per Session: Not on file  Stress:   . Feeling of Stress : Not on file  Social Connections:   . Frequency of Communication with Friends and Family: Not on file  . Frequency of Social Gatherings with Friends and Family: Not on file  . Attends Religious Services: Not on file  . Active Member of Clubs  or Organizations: Not on file  . Attends Archivist Meetings: Not on file  . Marital Status: Not on file     Family History:  The patient's family history includes Arthritis in his mother; COPD in his mother; Cancer in his maternal grandmother; Fibromyalgia in his mother. ***  ROS:   Please see the history of present illness.    ROS All other systems reviewed and are negative.   PHYSICAL EXAM:   VS:  There were no vitals taken for this visit.   GEN: Well nourished, well developed, in no acute distress  HEENT: normal  Neck: no JVD, carotid bruits, or masses Cardiac: ***RRR; no murmurs, rubs, or gallops,no edema  Respiratory:  clear to auscultation bilaterally, normal work of breathing GI: soft, nontender, nondistended, + BS MS: no deformity or atrophy  Skin: warm and dry, no rash Neuro:  Alert and Oriented x 3, Strength and sensation are intact Psych: euthymic mood, full affect  Wt Readings from Last 3 Encounters:  05/08/18 221 lb 12.8 oz (100.6 kg)  06/05/17 225 lb (102.1 kg)  03/19/17 215 lb (97.5 kg)      Studies/Labs Reviewed:   EKG:  EKG is ordered today.  The ekg ordered today demonstrates ***  Recent Labs: No results found for requested labs within last 8760 hours.   Lipid Panel    Component Value Date/Time   CHOL 128 06/21/2017 0818   TRIG 100 06/21/2017 0818   HDL 44 06/21/2017 0818   CHOLHDL 2.9 06/21/2017 0818   CHOLHDL 3 03/10/2015 0930   VLDL 25.0 03/10/2015 0930   LDLCALC 64 06/21/2017 0818   LDLDIRECT 140.0 05/13/2013 1306    Additional studies/ records that were reviewed today include:   As summarized above    ASSESSMENT & PLAN:    HTN  ? Brain aneusyma - Last scan 06/2018 showed Unchanged small bilateral supraclinoid ICA outpouchings, favor infundibula over aneurysms    Medication Adjustments/Labs and Tests Ordered: Current medicines are reviewed at length with the patient today.  Concerns regarding medicines are outlined  above.  Medication changes, Labs and Tests ordered today are listed in the Patient Instructions below. There are no Patient Instructions on file for this visit.   Jarrett Soho, [place]  06/11/2019 2:05 PM    [facility] [place], [place], [place]  [address] Phone: [phone]; Fax: [phone]

## 2019-06-12 ENCOUNTER — Ambulatory Visit: Payer: Self-pay | Admitting: Physician Assistant

## 2019-06-16 DIAGNOSIS — M6281 Muscle weakness (generalized): Secondary | ICD-10-CM | POA: Diagnosis not present

## 2019-06-16 DIAGNOSIS — M25512 Pain in left shoulder: Secondary | ICD-10-CM | POA: Diagnosis not present

## 2019-06-16 DIAGNOSIS — M25612 Stiffness of left shoulder, not elsewhere classified: Secondary | ICD-10-CM | POA: Diagnosis not present

## 2019-06-18 DIAGNOSIS — M25612 Stiffness of left shoulder, not elsewhere classified: Secondary | ICD-10-CM | POA: Diagnosis not present

## 2019-06-18 DIAGNOSIS — M6281 Muscle weakness (generalized): Secondary | ICD-10-CM | POA: Diagnosis not present

## 2019-06-18 DIAGNOSIS — M25512 Pain in left shoulder: Secondary | ICD-10-CM | POA: Diagnosis not present

## 2019-06-23 DIAGNOSIS — M25612 Stiffness of left shoulder, not elsewhere classified: Secondary | ICD-10-CM | POA: Diagnosis not present

## 2019-06-23 DIAGNOSIS — M25512 Pain in left shoulder: Secondary | ICD-10-CM | POA: Diagnosis not present

## 2019-06-23 DIAGNOSIS — M6281 Muscle weakness (generalized): Secondary | ICD-10-CM | POA: Diagnosis not present

## 2019-06-25 DIAGNOSIS — M25512 Pain in left shoulder: Secondary | ICD-10-CM | POA: Diagnosis not present

## 2019-06-25 DIAGNOSIS — M25612 Stiffness of left shoulder, not elsewhere classified: Secondary | ICD-10-CM | POA: Diagnosis not present

## 2019-06-25 DIAGNOSIS — M6281 Muscle weakness (generalized): Secondary | ICD-10-CM | POA: Diagnosis not present

## 2019-07-02 DIAGNOSIS — M25512 Pain in left shoulder: Secondary | ICD-10-CM | POA: Diagnosis not present

## 2019-07-02 DIAGNOSIS — M6281 Muscle weakness (generalized): Secondary | ICD-10-CM | POA: Diagnosis not present

## 2019-07-02 DIAGNOSIS — M25612 Stiffness of left shoulder, not elsewhere classified: Secondary | ICD-10-CM | POA: Diagnosis not present

## 2019-07-06 DIAGNOSIS — M6281 Muscle weakness (generalized): Secondary | ICD-10-CM | POA: Diagnosis not present

## 2019-07-06 DIAGNOSIS — M25612 Stiffness of left shoulder, not elsewhere classified: Secondary | ICD-10-CM | POA: Diagnosis not present

## 2019-07-06 DIAGNOSIS — M25512 Pain in left shoulder: Secondary | ICD-10-CM | POA: Diagnosis not present

## 2019-07-08 DIAGNOSIS — M6281 Muscle weakness (generalized): Secondary | ICD-10-CM | POA: Diagnosis not present

## 2019-07-08 DIAGNOSIS — M25612 Stiffness of left shoulder, not elsewhere classified: Secondary | ICD-10-CM | POA: Diagnosis not present

## 2019-07-08 DIAGNOSIS — M25512 Pain in left shoulder: Secondary | ICD-10-CM | POA: Diagnosis not present

## 2019-07-13 DIAGNOSIS — M25612 Stiffness of left shoulder, not elsewhere classified: Secondary | ICD-10-CM | POA: Diagnosis not present

## 2019-07-13 DIAGNOSIS — M6281 Muscle weakness (generalized): Secondary | ICD-10-CM | POA: Diagnosis not present

## 2019-07-13 DIAGNOSIS — M25512 Pain in left shoulder: Secondary | ICD-10-CM | POA: Diagnosis not present

## 2019-07-13 NOTE — Progress Notes (Signed)
Cardiology Office Note    Date:  07/14/2019   ID:  Alexander Lamb, DOB 08-04-1960, MRN [id number]  PCP:  Posey Boyer, MD  Cardiologist: Dr. Burt Knack  Chief Complaint: 14  Months follow up  History of Present Illness:   Alexander Lamb is a 59 y.o. male  brain infundibula/aneurysm, HTN and HLD presents for follow up.   He followed Dr. Burt Knack for blood pressure management. Patient compliance headache during visit 06/2017. Family hx of cerebral aneurysm. Follow up brain CTA showed bilateral supraclinoid outpouchings measuring up to 2 mm on the right where conical shape favors infundibulum over aneurysm. Follow up 1 year repeat scan 06/2018 showed unchanged small bilateral supraclinoid ICA outpouchings, favor infundibula over aneurysms.  BP was elevated during last OV 05/2018.  Here today for follow up. He and his wife had COVID in Oct. It was mild case. Recovered well. Did not required hospitalization. Had L shoulder surgery in Nov 2020, since than he has intermittent L should pain and L wrist swelling. Walks without chest pain or dyspnea. Compliant with medications.   Past Medical History:  Diagnosis Date  . Allergy   . Arthritis   . Carotid stenosis    a.  Carotid US (05/2013): Bilateral 40-59% ICA stenosis (F/u 1 year);  b.  Carotid US (11/15):  No ICA stenosis  . Hx of echocardiogram    Echo (11/14):  EF 55-60%, normal LV thickness, Gr 1 DD, mild LAE  . Hyperlipidemia   . Hypertension     Past Surgical History:  Procedure Laterality Date  . TONSILLECTOMY      Current Medications: Prior to Admission medications   Medication Sig Start Date End Date Taking? Authorizing Provider  amLODipine (NORVASC) 5 MG tablet Take 1 tablet (5 mg total) by mouth daily. 07/22/18   Sherren Mocha, MD  Aspirin-Acetaminophen-Caffeine (EXCEDRIN PO) Take 1 tablet by mouth as needed.    [provider]  CIALIS 5 MG tablet Take 5 mg by mouth daily as needed. Reported on 09/16/2015  01/10/15   [provider]  clomiPHENE (CLOMID) 50 MG tablet Take by mouth daily.    [provider]  lisinopril (PRINIVIL,ZESTRIL) 40 MG tablet Take 1 tablet (40 mg total) by mouth daily. 07/22/18   Sherren Mocha, MD  methocarbamol (ROBAXIN) 750 MG tablet Take 1 tablet (750 mg total) by mouth every 4 (four) hours. Patient not taking: Reported on 06/20/2018 01/24/18   Melynda Ripple, MD  naproxen (NAPROSYN) 500 MG tablet Take 1 tablet (500 mg total) by mouth 2 (two) times daily. Patient taking differently: Take 500 mg by mouth 2 (two) times daily as needed (joint pain).  01/24/18   Melynda Ripple, MD  OMEPRAZOLE PO Take 1 tablet by mouth as needed.    [provider]  rosuvastatin (CRESTOR) 10 MG tablet TAKE 1 TABLET BY MOUTH EVERY DAY 07/28/18   Sherren Mocha, MD  tamsulosin (FLOMAX) 0.4 MG CAPS capsule Take 0.4 mg by mouth.    [provider]    Allergies:   Sulfa antibiotics and Oxycodone   Social History   Socioeconomic History  . Marital status: Married    Spouse name: Not on file  . Number of children: Not on file  . Years of education: Not on file  . Highest education level: Not on file  Occupational History  . Not on file  Tobacco Use  . Smoking status: Never Smoker  . Smokeless tobacco: Never Used  Substance and Sexual  Activity  . Alcohol use: Yes    Alcohol/week: 1.0 - 2.0 standard drinks    Types: 1 - 2 Standard drinks or equivalent per week  . Drug use: No  . Sexual activity: Yes    Birth control/protection: None  Other Topics Concern  . Not on file  Social History Narrative  . Not on file   Social Determinants of Health   Financial Resource Strain:   . Difficulty of Paying Living Expenses: Not on file  Food Insecurity:   . Worried About Programme researcher, broadcasting/film/video in the Last Year: Not on file  . Ran Out of Food in the Last Year: Not on file  Transportation Needs:   . Lack of Transportation (Medical): Not on file  . Lack of  Transportation (Non-Medical): Not on file  Physical Activity:   . Days of Exercise per Week: Not on file  . Minutes of Exercise per Session: Not on file  Stress:   . Feeling of Stress : Not on file  Social Connections:   . Frequency of Communication with Friends and Family: Not on file  . Frequency of Social Gatherings with Friends and Family: Not on file  . Attends Religious Services: Not on file  . Active Member of Clubs or Organizations: Not on file  . Attends Banker Meetings: Not on file  . Marital Status: Not on file     Family History:  The patient's family history includes Arthritis in his mother; COPD in his mother; Cancer in his maternal grandmother; Fibromyalgia in his mother.   ROS:   Please see the history of present illness.    ROS All other systems reviewed and are negative.   PHYSICAL EXAM:   VS:  BP 126/74   Pulse 84   Ht 5\' 7"  (1.702 m)   Wt 226 lb 3.2 oz (102.6 kg)   SpO2 97%   BMI 35.43 kg/m    GEN: Well nourished, well developed, in no acute distress  HEENT: normal  Neck: no JVD, carotid bruits, or masses Cardiac: RRR; no murmurs, rubs, or gallops,no edema  Respiratory:  clear to auscultation bilaterally, normal work of breathing GI: soft, nontender, nondistended, + BS MS: no deformity or atrophy, L wrist swelling  Skin: warm and dry, no rash Neuro:  Alert and Oriented x 3, Strength and sensation are intact Psych: euthymic mood, full affect  Wt Readings from Last 3 Encounters:  07/14/19 226 lb 3.2 oz (102.6 kg)  05/08/18 221 lb 12.8 oz (100.6 kg)  06/05/17 225 lb (102.1 kg)      Studies/Labs Reviewed:   EKG:  EKG is ordered today.  The ekg ordered today demonstrates NSR, no change   Recent Labs: No results found for requested labs within last 8760 hours.   Lipid Panel    Component Value Date/Time   CHOL 128 06/21/2017 0818   TRIG 100 06/21/2017 0818   HDL 44 06/21/2017 0818   CHOLHDL 2.9 06/21/2017 0818   CHOLHDL 3  03/10/2015 0930   VLDL 25.0 03/10/2015 0930   LDLCALC 64 06/21/2017 0818   LDLDIRECT 140.0 05/13/2013 1306    Additional studies/ records that were reviewed today include:   Echocardiogram: 05/2013 Left ventricle: The cavity size was normal. Wall thickness  was normal. Systolic function was normal. The estimated  ejection fraction was in the range of 55% to 60%. Wall  motion was normal; there were no regional wall motion  abnormalities. Doppler parameters are consistent with  abnormal left ventricular relaxation (grade 1 diastolic  dysfunction).  - Aortic valve: There was no stenosis.  - Mitral valve: No significant regurgitation.  - Left atrium: The atrium was mildly dilated.  - Right ventricle: The cavity size was normal. Systolic  function was normal.  - Pulmonary arteries: No complete TR doppler jet so unable  to estimate PA systolic pressure.  - Inferior vena cava: The vessel was normal in size; the  respirophasic diameter changes were in the normal range (=  50%); findings are consistent with normal central venous  pressure.  Impressions:   - No definite etiology for murmur noted.   ASSESSMENT & PLAN:    1. HTN - BP stable. No change.   2.  brain infundibula/aneurysm,  -Uncharged per last study 06/2018. PRN scan.   3. HLD - Continue Crestor. Check labs.   4. Hx of COVID - recovered well  5. S/p L shoulder surgery - Having pain and swelling. Has appointment with orthopedist next week     Medication Adjustments/Labs and Tests Ordered: Current medicines are reviewed at length with the patient today.  Concerns regarding medicines are outlined above.  Medication changes, Labs and Tests ordered today are listed in the Patient Instructions below. Patient Instructions  Medication Instructions:   Your physician recommends that you continue on your current medications as directed. Please refer to the Current Medication list given to you  today.  *If you need a refill on your cardiac medications before your next appointment, please call your pharmacy*  Lab Work:  LFT AND LIPIDS TODAY   If you have labs (blood work) drawn today and your tests are completely normal, you will receive your results only by: Marland Kitchen MyChart Message (if you have MyChart) OR . A paper copy in the mail If you have any lab test that is abnormal or we need to change your treatment, we will call you to review the results.  Testing/Procedures: NONE ORDERED  TODAY   Follow-Up: At [facility], you and your health needs are our priority.  As part of our continuing mission to provide you with exceptional heart care, we have created designated Provider Care Teams.  These Care Teams include your primary Cardiologist (physician) and Advanced Practice Providers (APPs -  Physician Assistants and Nurse Practitioners) who all work together to provide you with the care you need, when you need it.  Your next appointment:   1 year(s)  The format for your next appointment:   In Person  Provider:   You may see No primary care provider on file. or one of the following Advanced Practice Providers on your designated Care Team:    Tereso Newcomer, PA-C  Vin [place], [place]  Berton Bon, NP   Other Instructions     Lorelei Pont, [place]  07/14/2019 10:53 AM    [facility] [address] [place], [place], [place]  [address] Phone: [phone]; Fax: [phone]

## 2019-07-14 ENCOUNTER — Other Ambulatory Visit: Payer: Self-pay

## 2019-07-14 ENCOUNTER — Ambulatory Visit (INDEPENDENT_AMBULATORY_CARE_PROVIDER_SITE_OTHER): Payer: BC Managed Care – PPO | Admitting: Physician Assistant

## 2019-07-14 ENCOUNTER — Encounter: Payer: Self-pay | Admitting: Physician Assistant

## 2019-07-14 VITALS — BP 126/74 | HR 84 | Ht 67.0 in | Wt 226.2 lb

## 2019-07-14 DIAGNOSIS — R930 Abnormal findings on diagnostic imaging of skull and head, not elsewhere classified: Secondary | ICD-10-CM | POA: Diagnosis not present

## 2019-07-14 DIAGNOSIS — E782 Mixed hyperlipidemia: Secondary | ICD-10-CM

## 2019-07-14 DIAGNOSIS — Z79899 Other long term (current) drug therapy: Secondary | ICD-10-CM

## 2019-07-14 DIAGNOSIS — Z7189 Other specified counseling: Secondary | ICD-10-CM

## 2019-07-14 DIAGNOSIS — I10 Essential (primary) hypertension: Secondary | ICD-10-CM | POA: Diagnosis not present

## 2019-07-14 LAB — LIPID PANEL
Chol/HDL Ratio: 3.8 ratio (ref 0.0–5.0)
Cholesterol, Total: 170 mg/dL (ref 100–199)
HDL: 45 mg/dL (ref >39)
LDL Chol Calc (NIH): 95 mg/dL (ref 0–99)
Triglycerides: 172 mg/dL — ABNORMAL HIGH (ref 0–149)
VLDL Cholesterol Cal: 30 mg/dL (ref 5–40)

## 2019-07-14 LAB — HEPATIC FUNCTION PANEL
ALT: 23 IU/L (ref 0–44)
AST: 21 IU/L (ref 0–40)
Albumin: 4.4 g/dL (ref 3.8–4.9)
Alkaline Phosphatase: 62 IU/L (ref 39–117)
Bilirubin Total: 0.6 mg/dL (ref 0.0–1.2)
Bilirubin, Direct: 0.17 mg/dL (ref 0.00–0.40)
Total Protein: 6.5 g/dL (ref 6.0–8.5)

## 2019-07-14 NOTE — Patient Instructions (Signed)
Medication Instructions:   Your physician recommends that you continue on your current medications as directed. Please refer to the Current Medication list given to you today.  *If you need a refill on your cardiac medications before your next appointment, please call your pharmacy*  Lab Work:  LFT AND LIPIDS TODAY   If you have labs (blood work) drawn today and your tests are completely normal, you will receive your results only by: Marland Kitchen MyChart Message (if you have MyChart) OR . A paper copy in the mail If you have any lab test that is abnormal or we need to change your treatment, we will call you to review the results.  Testing/Procedures: NONE ORDERED  TODAY   Follow-Up: At [facility], you and your health needs are our priority.  As part of our continuing mission to provide you with exceptional heart care, we have created designated Provider Care Teams.  These Care Teams include your primary Cardiologist (physician) and Advanced Practice Providers (APPs -  Physician Assistants and Nurse Practitioners) who all work together to provide you with the care you need, when you need it.  Your next appointment:   1 year(s)  The format for your next appointment:   In Person  Provider:   You may see No primary care provider on file. or one of the following Advanced Practice Providers on your designated Care Team:    Tereso Newcomer, PA-C  Vin [place], [place]  Berton Bon, NP   Other Instructions

## 2019-07-16 DIAGNOSIS — M25512 Pain in left shoulder: Secondary | ICD-10-CM | POA: Diagnosis not present

## 2019-07-16 DIAGNOSIS — M25612 Stiffness of left shoulder, not elsewhere classified: Secondary | ICD-10-CM | POA: Diagnosis not present

## 2019-07-16 DIAGNOSIS — M6281 Muscle weakness (generalized): Secondary | ICD-10-CM | POA: Diagnosis not present

## 2019-07-21 DIAGNOSIS — M6281 Muscle weakness (generalized): Secondary | ICD-10-CM | POA: Diagnosis not present

## 2019-07-21 DIAGNOSIS — H25813 Combined forms of age-related cataract, bilateral: Secondary | ICD-10-CM | POA: Diagnosis not present

## 2019-07-21 DIAGNOSIS — H43813 Vitreous degeneration, bilateral: Secondary | ICD-10-CM | POA: Diagnosis not present

## 2019-07-21 DIAGNOSIS — H52222 Regular astigmatism, left eye: Secondary | ICD-10-CM | POA: Diagnosis not present

## 2019-07-21 DIAGNOSIS — M25512 Pain in left shoulder: Secondary | ICD-10-CM | POA: Diagnosis not present

## 2019-07-21 DIAGNOSIS — H40003 Preglaucoma, unspecified, bilateral: Secondary | ICD-10-CM | POA: Diagnosis not present

## 2019-07-21 DIAGNOSIS — H527 Unspecified disorder of refraction: Secondary | ICD-10-CM | POA: Diagnosis not present

## 2019-07-21 DIAGNOSIS — M25612 Stiffness of left shoulder, not elsewhere classified: Secondary | ICD-10-CM | POA: Diagnosis not present

## 2019-07-23 DIAGNOSIS — M6281 Muscle weakness (generalized): Secondary | ICD-10-CM | POA: Diagnosis not present

## 2019-07-23 DIAGNOSIS — M25612 Stiffness of left shoulder, not elsewhere classified: Secondary | ICD-10-CM | POA: Diagnosis not present

## 2019-07-23 DIAGNOSIS — M25512 Pain in left shoulder: Secondary | ICD-10-CM | POA: Diagnosis not present

## 2019-07-29 DIAGNOSIS — M6281 Muscle weakness (generalized): Secondary | ICD-10-CM | POA: Diagnosis not present

## 2019-07-29 DIAGNOSIS — M25612 Stiffness of left shoulder, not elsewhere classified: Secondary | ICD-10-CM | POA: Diagnosis not present

## 2019-07-29 DIAGNOSIS — M25512 Pain in left shoulder: Secondary | ICD-10-CM | POA: Diagnosis not present

## 2019-07-31 DIAGNOSIS — M6281 Muscle weakness (generalized): Secondary | ICD-10-CM | POA: Diagnosis not present

## 2019-07-31 DIAGNOSIS — M25512 Pain in left shoulder: Secondary | ICD-10-CM | POA: Diagnosis not present

## 2019-07-31 DIAGNOSIS — M25612 Stiffness of left shoulder, not elsewhere classified: Secondary | ICD-10-CM | POA: Diagnosis not present

## 2019-08-03 ENCOUNTER — Other Ambulatory Visit: Payer: Self-pay | Admitting: Cardiovascular Disease

## 2019-08-04 DIAGNOSIS — M6281 Muscle weakness (generalized): Secondary | ICD-10-CM | POA: Diagnosis not present

## 2019-08-04 DIAGNOSIS — M25612 Stiffness of left shoulder, not elsewhere classified: Secondary | ICD-10-CM | POA: Diagnosis not present

## 2019-08-04 DIAGNOSIS — M25512 Pain in left shoulder: Secondary | ICD-10-CM | POA: Diagnosis not present

## 2019-08-06 DIAGNOSIS — M25512 Pain in left shoulder: Secondary | ICD-10-CM | POA: Diagnosis not present

## 2019-08-06 DIAGNOSIS — R351 Nocturia: Secondary | ICD-10-CM | POA: Diagnosis not present

## 2019-08-06 DIAGNOSIS — N401 Enlarged prostate with lower urinary tract symptoms: Secondary | ICD-10-CM | POA: Diagnosis not present

## 2019-08-06 DIAGNOSIS — M25612 Stiffness of left shoulder, not elsewhere classified: Secondary | ICD-10-CM | POA: Diagnosis not present

## 2019-08-06 DIAGNOSIS — M6281 Muscle weakness (generalized): Secondary | ICD-10-CM | POA: Diagnosis not present

## 2019-08-11 DIAGNOSIS — M25612 Stiffness of left shoulder, not elsewhere classified: Secondary | ICD-10-CM | POA: Diagnosis not present

## 2019-08-11 DIAGNOSIS — M25512 Pain in left shoulder: Secondary | ICD-10-CM | POA: Diagnosis not present

## 2019-08-11 DIAGNOSIS — M6281 Muscle weakness (generalized): Secondary | ICD-10-CM | POA: Diagnosis not present

## 2019-08-13 DIAGNOSIS — E291 Testicular hypofunction: Secondary | ICD-10-CM | POA: Diagnosis not present

## 2019-08-13 DIAGNOSIS — R351 Nocturia: Secondary | ICD-10-CM | POA: Diagnosis not present

## 2019-08-13 DIAGNOSIS — N401 Enlarged prostate with lower urinary tract symptoms: Secondary | ICD-10-CM | POA: Diagnosis not present

## 2019-08-14 DIAGNOSIS — M6281 Muscle weakness (generalized): Secondary | ICD-10-CM | POA: Diagnosis not present

## 2019-08-14 DIAGNOSIS — M25512 Pain in left shoulder: Secondary | ICD-10-CM | POA: Diagnosis not present

## 2019-08-14 DIAGNOSIS — M25612 Stiffness of left shoulder, not elsewhere classified: Secondary | ICD-10-CM | POA: Diagnosis not present

## 2019-08-17 DIAGNOSIS — M25512 Pain in left shoulder: Secondary | ICD-10-CM | POA: Diagnosis not present

## 2019-08-17 DIAGNOSIS — M6281 Muscle weakness (generalized): Secondary | ICD-10-CM | POA: Diagnosis not present

## 2019-08-17 DIAGNOSIS — M25612 Stiffness of left shoulder, not elsewhere classified: Secondary | ICD-10-CM | POA: Diagnosis not present

## 2019-08-20 DIAGNOSIS — M25512 Pain in left shoulder: Secondary | ICD-10-CM | POA: Diagnosis not present

## 2019-08-20 DIAGNOSIS — M6281 Muscle weakness (generalized): Secondary | ICD-10-CM | POA: Diagnosis not present

## 2019-08-20 DIAGNOSIS — M25612 Stiffness of left shoulder, not elsewhere classified: Secondary | ICD-10-CM | POA: Diagnosis not present

## 2019-08-25 DIAGNOSIS — M25512 Pain in left shoulder: Secondary | ICD-10-CM | POA: Diagnosis not present

## 2019-08-25 DIAGNOSIS — M6281 Muscle weakness (generalized): Secondary | ICD-10-CM | POA: Diagnosis not present

## 2019-08-25 DIAGNOSIS — M25612 Stiffness of left shoulder, not elsewhere classified: Secondary | ICD-10-CM | POA: Diagnosis not present

## 2019-08-28 DIAGNOSIS — M25612 Stiffness of left shoulder, not elsewhere classified: Secondary | ICD-10-CM | POA: Diagnosis not present

## 2019-08-28 DIAGNOSIS — M25512 Pain in left shoulder: Secondary | ICD-10-CM | POA: Diagnosis not present

## 2019-08-28 DIAGNOSIS — M6281 Muscle weakness (generalized): Secondary | ICD-10-CM | POA: Diagnosis not present

## 2019-09-01 DIAGNOSIS — M6281 Muscle weakness (generalized): Secondary | ICD-10-CM | POA: Diagnosis not present

## 2019-09-01 DIAGNOSIS — M25512 Pain in left shoulder: Secondary | ICD-10-CM | POA: Diagnosis not present

## 2019-09-01 DIAGNOSIS — M25612 Stiffness of left shoulder, not elsewhere classified: Secondary | ICD-10-CM | POA: Diagnosis not present

## 2019-09-03 DIAGNOSIS — M25512 Pain in left shoulder: Secondary | ICD-10-CM | POA: Diagnosis not present

## 2019-09-03 DIAGNOSIS — M6281 Muscle weakness (generalized): Secondary | ICD-10-CM | POA: Diagnosis not present

## 2019-09-03 DIAGNOSIS — M25612 Stiffness of left shoulder, not elsewhere classified: Secondary | ICD-10-CM | POA: Diagnosis not present

## 2019-09-09 DIAGNOSIS — M25612 Stiffness of left shoulder, not elsewhere classified: Secondary | ICD-10-CM | POA: Diagnosis not present

## 2019-09-09 DIAGNOSIS — M25512 Pain in left shoulder: Secondary | ICD-10-CM | POA: Diagnosis not present

## 2019-09-09 DIAGNOSIS — M6281 Muscle weakness (generalized): Secondary | ICD-10-CM | POA: Diagnosis not present

## 2019-09-17 DIAGNOSIS — M25512 Pain in left shoulder: Secondary | ICD-10-CM | POA: Diagnosis not present

## 2019-09-17 DIAGNOSIS — M6281 Muscle weakness (generalized): Secondary | ICD-10-CM | POA: Diagnosis not present

## 2019-09-17 DIAGNOSIS — M25612 Stiffness of left shoulder, not elsewhere classified: Secondary | ICD-10-CM | POA: Diagnosis not present

## 2019-09-19 ENCOUNTER — Other Ambulatory Visit: Payer: Self-pay | Admitting: Cardiovascular Disease

## 2019-09-21 DIAGNOSIS — M25512 Pain in left shoulder: Secondary | ICD-10-CM | POA: Diagnosis not present

## 2019-09-24 DIAGNOSIS — M25612 Stiffness of left shoulder, not elsewhere classified: Secondary | ICD-10-CM | POA: Diagnosis not present

## 2019-09-24 DIAGNOSIS — M25512 Pain in left shoulder: Secondary | ICD-10-CM | POA: Diagnosis not present

## 2019-09-24 DIAGNOSIS — M6281 Muscle weakness (generalized): Secondary | ICD-10-CM | POA: Diagnosis not present

## 2019-10-01 DIAGNOSIS — M6281 Muscle weakness (generalized): Secondary | ICD-10-CM | POA: Diagnosis not present

## 2019-10-01 DIAGNOSIS — M25612 Stiffness of left shoulder, not elsewhere classified: Secondary | ICD-10-CM | POA: Diagnosis not present

## 2019-10-01 DIAGNOSIS — M25512 Pain in left shoulder: Secondary | ICD-10-CM | POA: Diagnosis not present

## 2019-10-08 DIAGNOSIS — M25512 Pain in left shoulder: Secondary | ICD-10-CM | POA: Diagnosis not present

## 2019-10-08 DIAGNOSIS — M25612 Stiffness of left shoulder, not elsewhere classified: Secondary | ICD-10-CM | POA: Diagnosis not present

## 2019-10-08 DIAGNOSIS — M6281 Muscle weakness (generalized): Secondary | ICD-10-CM | POA: Diagnosis not present

## 2019-10-14 DIAGNOSIS — M25612 Stiffness of left shoulder, not elsewhere classified: Secondary | ICD-10-CM | POA: Diagnosis not present

## 2019-10-14 DIAGNOSIS — M6281 Muscle weakness (generalized): Secondary | ICD-10-CM | POA: Diagnosis not present

## 2019-10-14 DIAGNOSIS — M25512 Pain in left shoulder: Secondary | ICD-10-CM | POA: Diagnosis not present

## 2019-10-19 DIAGNOSIS — M25512 Pain in left shoulder: Secondary | ICD-10-CM | POA: Diagnosis not present

## 2019-10-24 ENCOUNTER — Other Ambulatory Visit: Payer: Self-pay | Admitting: Cardiovascular Disease

## 2019-10-26 NOTE — Telephone Encounter (Signed)
Rx refill-Crestor Apt with Vin B 07/14/19 F/u in one year

## 2019-10-29 DIAGNOSIS — M25612 Stiffness of left shoulder, not elsewhere classified: Secondary | ICD-10-CM | POA: Diagnosis not present

## 2019-10-29 DIAGNOSIS — M6281 Muscle weakness (generalized): Secondary | ICD-10-CM | POA: Diagnosis not present

## 2019-10-29 DIAGNOSIS — M25512 Pain in left shoulder: Secondary | ICD-10-CM | POA: Diagnosis not present

## 2019-11-03 ENCOUNTER — Other Ambulatory Visit: Payer: Self-pay

## 2019-11-03 MED ORDER — LISINOPRIL 40 MG PO TABS: 40.0000 mg | ORAL_TABLET | Freq: Every day | ORAL | 2 refills | Status: DC

## 2019-11-03 MED ORDER — AMLODIPINE BESYLATE 5 MG PO TABS: 5.0000 mg | ORAL_TABLET | Freq: Every day | ORAL | 2 refills | Status: DC

## 2019-11-03 MED ORDER — ROSUVASTATIN CALCIUM 10 MG PO TABS: 10.0000 mg | ORAL_TABLET | Freq: Every day | ORAL | 2 refills | Status: DC

## 2019-11-03 NOTE — Telephone Encounter (Signed)
Pt's medications were sent to pt's pharmacy as requested. Confirmation received.  

## 2020-06-09 ENCOUNTER — Telehealth: Payer: Self-pay

## 2020-06-09 NOTE — Telephone Encounter (Signed)
Completed PA on CoverMyMeds for Amlodipine.  Waiting for response..  KEY: [id number]

## 2020-06-10 NOTE — Telephone Encounter (Signed)
Received notification that PA is not required through his insurance. I called his pharmacy and they were able to get the prescription to go through.

## 2020-06-27 IMAGING — CT CT ANGIO HEAD
4 of 14 series · 17 of 47 positions shown · IV contrast (ISOVUE 370)
Comparison: 06/18/2017

CLINICAL DATA: Prior abnormal head CTA with supraclinoid ICA
infundibula versus aneurysms. Headache.

EXAM:
CT ANGIOGRAPHY HEAD
TECHNIQUE: Multidetector CT imaging of the head was performed using the
standard protocol during bolus administration of intravenous
contrast. Multiplanar CT image reconstructions and MIPs were
obtained to evaluate the vascular anatomy.
CONTRAST:  80mL [id number] IOPAMIDOL ([id number]) INJECTION 76%

[Series 10: thin axial · axial · 0.38mm/px · z∈[-116,-2]mm · 7 of 154 slices shown]
[im 20/154  brain]
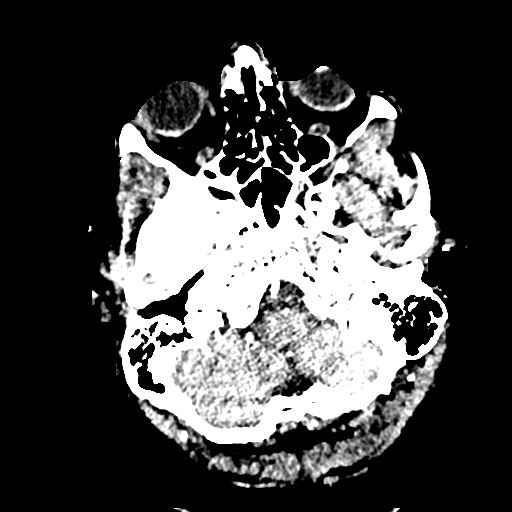
[im 39/154  bone]
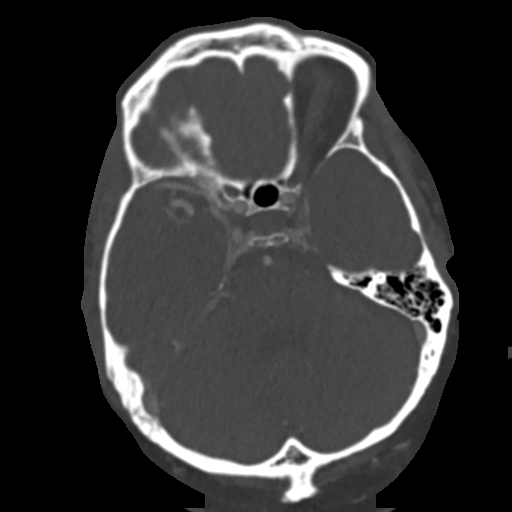
[im 58/154  brain]
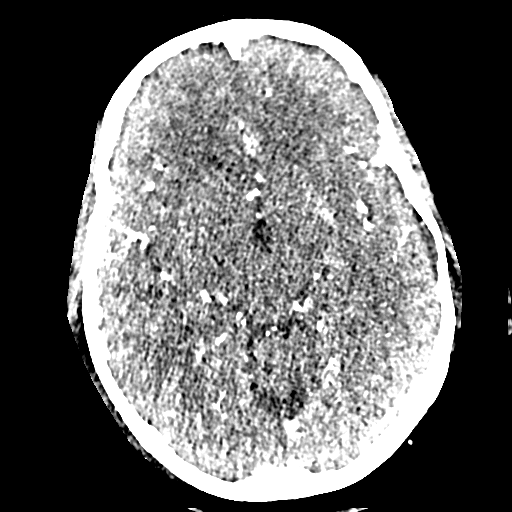
[im 77/154  bone]
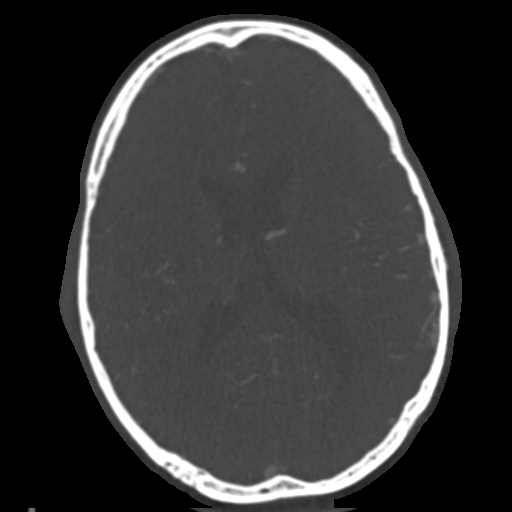
[im 96/154  brain]
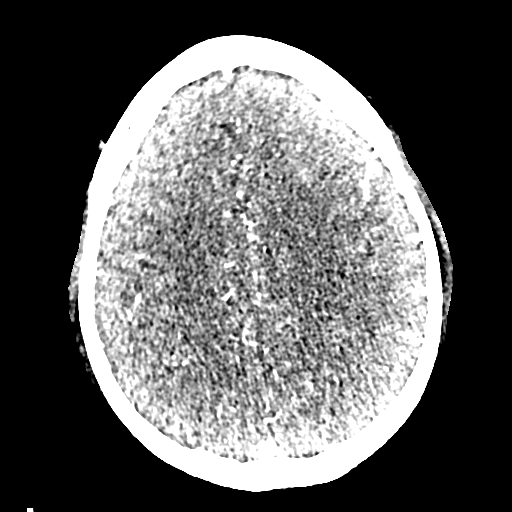
[im 115/154  bone]
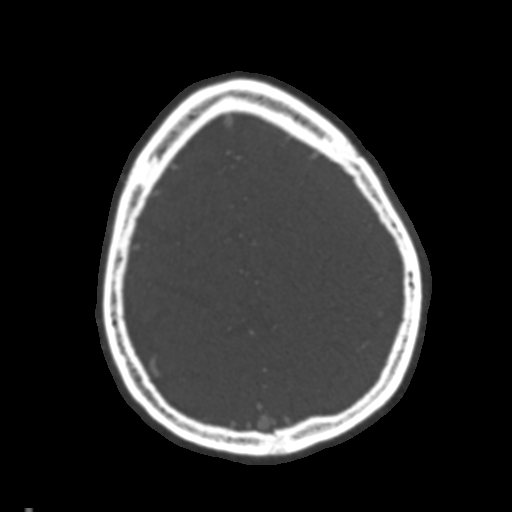
[im 134/154  brain]
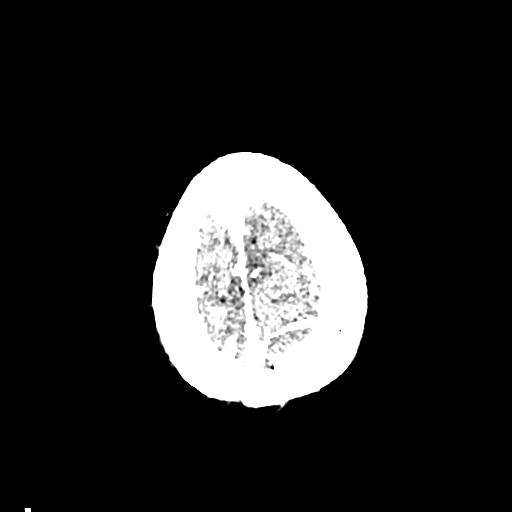

[Series 11: coronal thin · coronal · 0.33mm/px · 3 of 205 slices shown]
[im 41/205  brain]
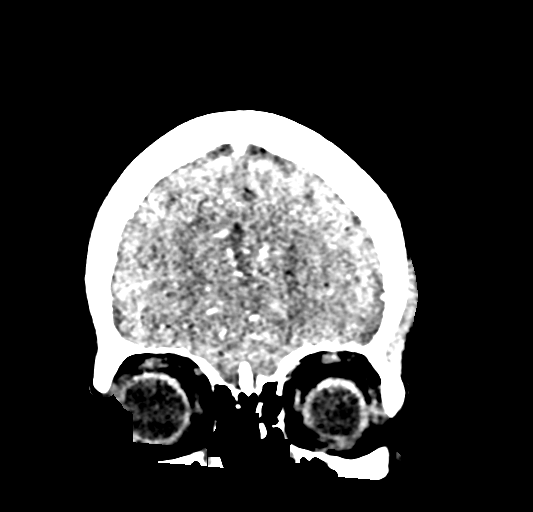
[im 82/205  brain]
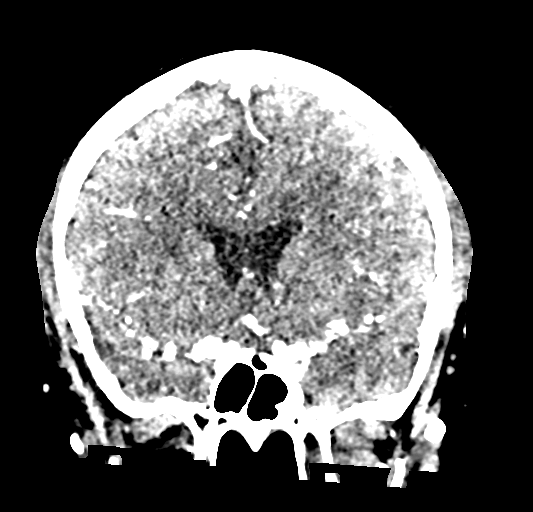
[im 123/205  brain]
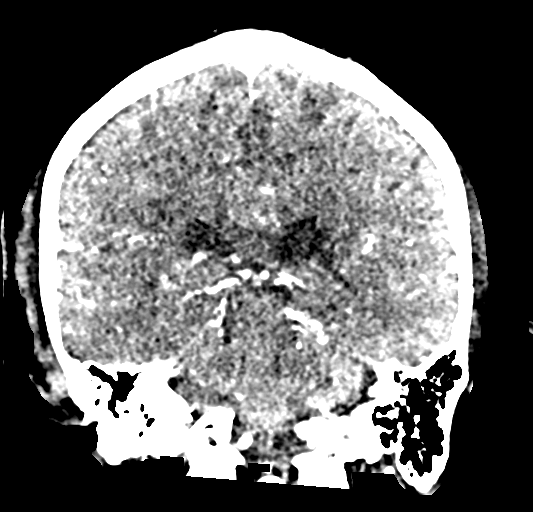

[Series 13: sagittal thin · sagittal · 0.30mm/px · 3 of 175 slices shown]
[im 44/175  brain]
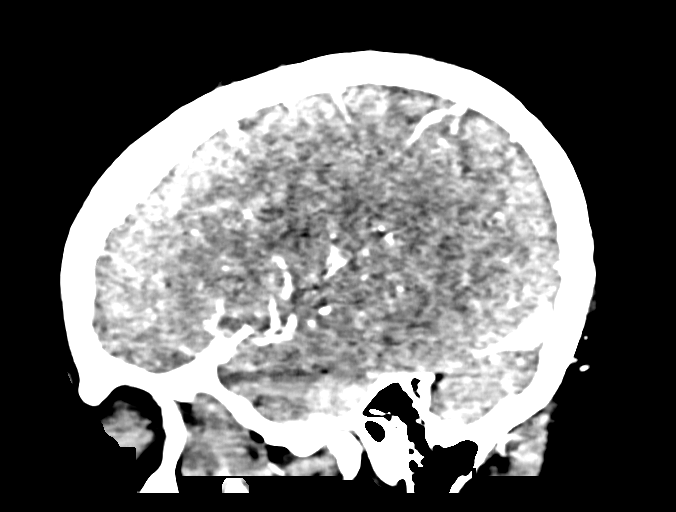
[im 88/175  brain]
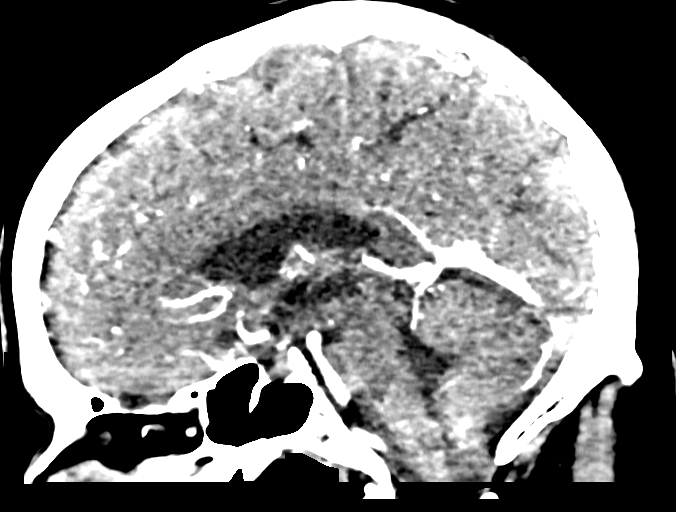
[im 131/175  brain]
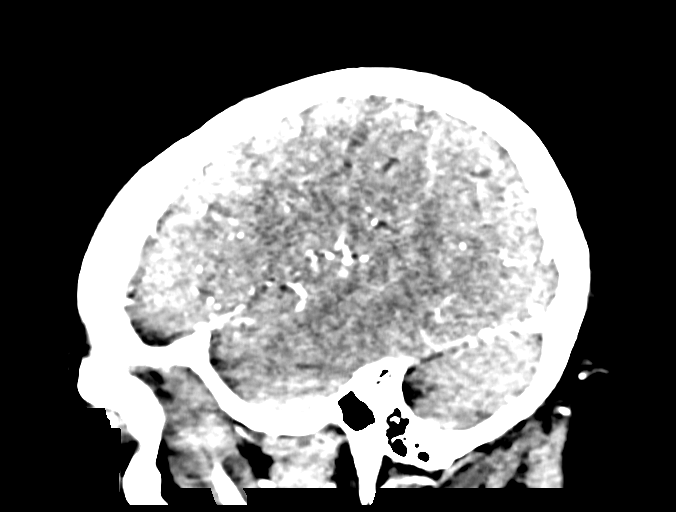

[Series 16: axial mpr · axial · 0.35mm/px · z∈[-136,-78]mm · 4 of 175 slices shown]
[im 20/175  brain]
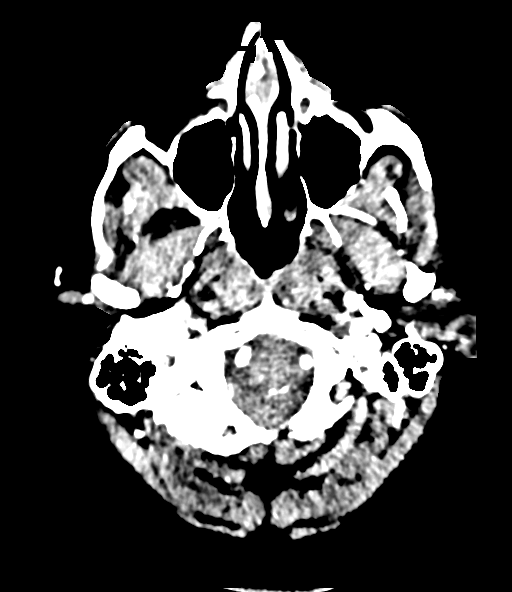
[im 39/175  brain]
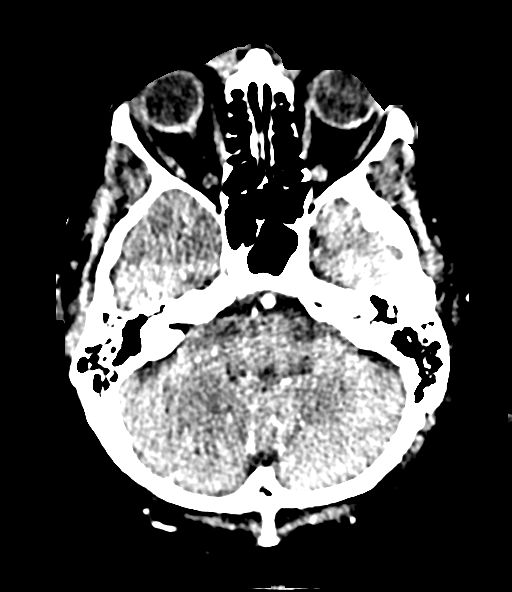
[im 59/175  brain]
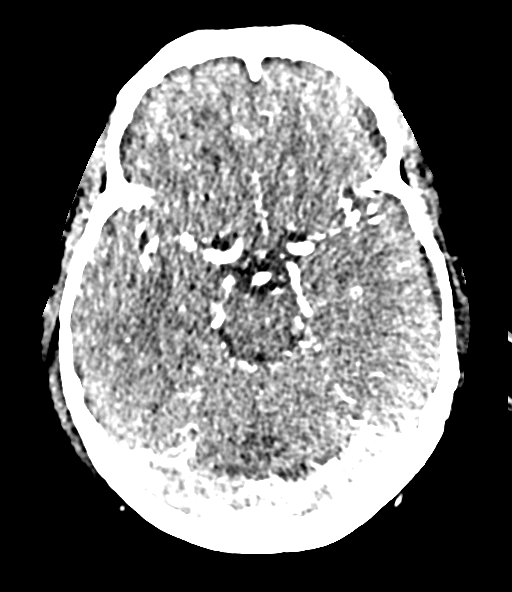
[im 78/175  brain]
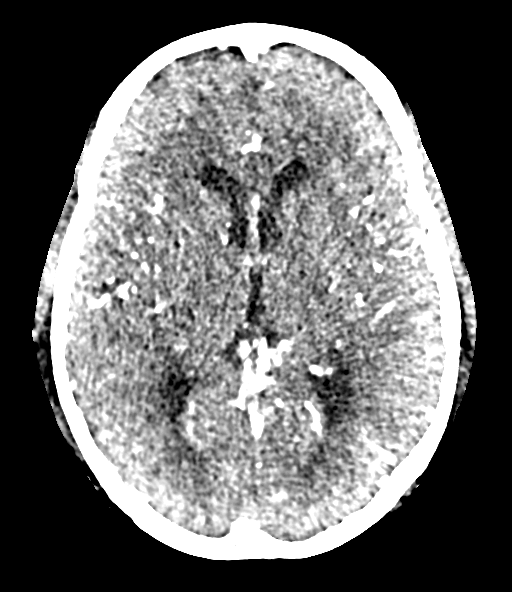

[17 of 47 positions shown; findings below may reference images not displayed]

FINDINGS: CT HEAD

Brain: There is no evidence of acute infarct, intracranial
hemorrhage, mass, midline shift, or extra-axial fluid collection.
The ventricles and sulci are normal. Subtle cerebral white matter
hypoattenuation may reflect minimal chronic small vessel ischemic
disease.

Vascular: Evaluated below.

Skull: No fracture or focal osseous lesion.

Sinuses: Visualized paranasal sinuses and mastoid air cells are
clear.

Orbits: Visualized orbits are unremarkable.

CTA HEAD

Anterior circulation: The internal carotid arteries are widely
patent from skull base to carotid termini. 2 mm inferiorly directed
outpouching from the right supraclinoid ICA and a similar but
slightly smaller outpouching at this same level on the left are
unchanged, again without a vessel able to be resolved emanating from
either outpouching. ACAs and MCAs are patent without evidence of
proximal branch occlusion or significant stenosis.

Posterior circulation: The visualized distal vertebral arteries are
widely patent to the basilar and codominant. Dominant left PICA and
right AICA are again noted. Patent SCAs are seen bilaterally. The
basilar artery is widely patent. There is a small left posterior
communicating artery. A right posterior communicating artery is not
clearly identified. PCAs are patent without evidence of significant
stenosis. No aneurysm is identified.

Venous sinuses: Patent.

Anatomic variants: None.

Delayed phase: No abnormal enhancement.
IMPRESSION: 1. Unchanged small bilateral supraclinoid ICA outpouchings, favor
infundibula over aneurysms.
2. Otherwise negative CTA.

## 2020-08-01 ENCOUNTER — Other Ambulatory Visit: Payer: Self-pay

## 2020-08-01 ENCOUNTER — Ambulatory Visit (INDEPENDENT_AMBULATORY_CARE_PROVIDER_SITE_OTHER): Payer: PRIVATE HEALTH INSURANCE | Admitting: Physician Assistant

## 2020-08-01 ENCOUNTER — Encounter: Payer: Self-pay | Admitting: Physician Assistant

## 2020-08-01 VITALS — BP 110/70 | HR 69 | Ht 67.0 in | Wt 215.0 lb

## 2020-08-01 DIAGNOSIS — Z8249 Family history of ischemic heart disease and other diseases of the circulatory system: Secondary | ICD-10-CM | POA: Diagnosis not present

## 2020-08-01 DIAGNOSIS — I10 Essential (primary) hypertension: Secondary | ICD-10-CM | POA: Diagnosis not present

## 2020-08-01 DIAGNOSIS — E782 Mixed hyperlipidemia: Secondary | ICD-10-CM | POA: Diagnosis not present

## 2020-08-01 MED ORDER — AMLODIPINE BESYLATE 5 MG PO TABS: 5.0000 mg | ORAL_TABLET | Freq: Every day | ORAL | 3 refills | Status: DC

## 2020-08-01 NOTE — Progress Notes (Signed)
Cardiology Office Note:    Date:  08/01/2020   ID:  Alexander Lamb, DOB 1961/06/27, MRN [id number]  PCP:  Peyton Najjar, MD  [facility] Cardiologist:  Tonny Bollman, MD   [facility] Electrophysiologist:  None   Referring MD: Peyton Najjar, MD   Chief Complaint:  Follow-up (HTN, HLP)    Patient Profile:    Alexander Lamb is a 60 y.o. male with:   Hypertension  Hyperlipidemia  History of carotid artery stenosis  Carotid US 11/14: Bilateral ICA 40-59  Carotid US 11/15: No ICA stenosis  Carotid artery infundibula  Head CTA 12/19: small b/l supraclinoid ICA outpouchings (favor infundibula over aneurysms)  No f/u recommended   Hypogonadism  Prior CV studies:   Carotid US 11/15 No ICA stenosis  History of Present Illness:    Mr. Geibel was last seen in 1/21 by Manson Passey, PA-C.  He is seen for f/u.  He is here alone.  He is overall doing well.  Unfortunately, he got COVID-19 for the 2nd time 3-4 weeks ago.  He has essentially recovered.  He usually jogs 2-3 times a week (2-5 miles).  He has not had chest pain, shortness of breath, syncope, orthopnea, leg edema.       Past Medical History:  Diagnosis Date  . Allergy   . Arthritis   . Carotid stenosis    a.  Carotid US (05/2013): Bilateral 40-59% ICA stenosis (F/u 1 year);  b.  Carotid US (11/15):  No ICA stenosis  . Hx of echocardiogram    Echo (11/14):  EF 55-60%, normal LV thickness, Gr 1 DD, mild LAE  . Hyperlipidemia   . Hypertension     Current Medications: Current Meds  Medication Sig  . Aspirin-Acetaminophen-Caffeine (EXCEDRIN PO) Take 1 tablet by mouth as needed.  Marland Kitchen CIALIS 5 MG tablet Take 5 mg by mouth daily as needed. Reported on 09/16/2015  . clomiPHENE (CLOMID) 50 MG tablet Take by mouth daily.  Marland Kitchen lisinopril (ZESTRIL) 40 MG tablet Take 1 tablet (40 mg total) by mouth daily.  . naproxen (NAPROSYN) 500 MG tablet Take 1 tablet (500 mg total) by mouth 2 (two) times daily.  .  rosuvastatin (CRESTOR) 10 MG tablet Take 1 tablet (10 mg total) by mouth daily.  . tamsulosin (FLOMAX) 0.4 MG CAPS capsule Take 0.4 mg by mouth.  . [DISCONTINUED] amLODipine (NORVASC) 5 MG tablet Take 1 tablet (5 mg total) by mouth daily.     Allergies:   Sulfa antibiotics and Oxycodone   Social History   Tobacco Use  . Smoking status: Never Smoker  . Smokeless tobacco: Never Used  Vaping Use  . Vaping Use: Never used  Substance Use Topics  . Alcohol use: Yes    Alcohol/week: 1.0 - 2.0 standard drink    Types: 1 - 2 Standard drinks or equivalent per week  . Drug use: No     Family Hx: The patient's family history includes Arthritis in his mother; COPD in his mother; Cancer in his maternal grandmother; Fibromyalgia in his mother.  ROS   EKGs/Labs/Other Test Reviewed:    EKG:  EKG is   ordered today.  The ekg ordered today demonstrates normal sinus rhythm, HR 69, LAD, no ST-TW changes, no change from prior tracing, QTc 424 ms   Recent Labs: No results found for requested labs within last 8760 hours.   Recent Lipid Panel Lab Results  Component Value Date/Time   CHOL 170 07/14/2019 11:06 AM  TRIG 172 (H) 07/14/2019 11:06 AM   HDL 45 07/14/2019 11:06 AM   CHOLHDL 3.8 07/14/2019 11:06 AM   CHOLHDL 3 03/10/2015 09:30 AM   LDLCALC 95 07/14/2019 11:06 AM   LDLDIRECT 140.0 05/13/2013 01:06 PM      Risk Assessment/Calculations:      Physical Exam:    VS:  BP 110/70 (BP Location: Left Arm, Patient Position: Sitting, Cuff Size: Normal)   Pulse 69   Ht 5\' 7"  (1.702 m)   Wt 215 lb (97.5 kg)   SpO2 97%   BMI 33.67 kg/m     Wt Readings from Last 3 Encounters:  08/01/20 215 lb (97.5 kg)  07/14/19 226 lb 3.2 oz (102.6 kg)  05/08/18 221 lb 12.8 oz (100.6 kg)     Constitutional:      Appearance: Healthy appearance. Not in distress.  Neck:     Vascular: JVD normal.  Pulmonary:     Effort: Pulmonary effort is normal.     Breath sounds: No wheezing. No rales.   Cardiovascular:     Normal rate. Regular rhythm. Normal S1. Normal S2.     Murmurs: There is no murmur.  Edema:    Peripheral edema absent.  Abdominal:     Palpations: Abdomen is soft. There is no hepatomegaly.  Skin:    General: Skin is warm and dry.  Neurological:     General: No focal deficit present.     Mental Status: Alert and oriented to person, place and time.     Cranial Nerves: Cranial nerves are intact.       ASSESSMENT & PLAN:    1. Essential hypertension The patient's blood pressure is controlled on his current regimen.  Continue current therapy.    2. Mixed hyperlipidemia Continue statin Rx.  Arrange fasting CMET, Lipids   3. FHx of coronary artery disease  He has a FHx of CAD.  We discussed +/- proceeding with a routine ETT for screening.  However, he runs on a regular basis without symptoms.  He is welcome to schedule an ETT in the future if he would like.  He will let 13/07/19 know.     Dispo:  Return in about 1 year (around 08/01/2021) for Routine Follow Up, w/ Dr. 08/03/2021, or Excell Seltzer, PA-C, in person.   Medication Adjustments/Labs and Tests Ordered: Current medicines are reviewed at length with the patient today.  Concerns regarding medicines are outlined above.  Tests Ordered: Orders Placed This Encounter  Procedures  . Comprehensive metabolic panel  . Lipid panel  . EKG 12-Lead   Medication Changes: Meds ordered this encounter  Medications  . amLODipine (NORVASC) 5 MG tablet    Sig: Take 1 tablet (5 mg total) by mouth daily.    Dispense:  90 tablet    Refill:  3    PT REQUEST REFILL    Signed, Tereso Newcomer, PA-C  08/01/2020 2:31 PM    [facility] [address] [place], [place], [place]  [place] Phone: [phone]; Fax: [phone]

## 2020-08-01 NOTE — Patient Instructions (Signed)
Medication Instructions:  Your physician recommends that you continue on your current medications as directed. Please refer to the Current Medication list given to you today. *If you need a refill on your cardiac medications before your next appointment, please call your pharmacy*   Lab Work: Fasting CMET and Lipids in 3-4 weeks If you have labs (blood work) drawn today and your tests are completely normal, you will receive your results only by: Marland Kitchen MyChart Message (if you have MyChart) OR . A paper copy in the mail If you have any lab test that is abnormal or we need to change your treatment, we will call you to review the results.   Testing/Procedures: None   Follow-Up: At [facility], you and your health needs are our priority.  As part of our continuing mission to provide you with exceptional heart care, we have created designated Provider Care Teams.  These Care Teams include your primary Cardiologist (physician) and Advanced Practice Providers (APPs -  Physician Assistants and Nurse Practitioners) who all work together to provide you with the care you need, when you need it.   Your next appointment:   1 year(s)  The format for your next appointment:   In Person  Provider:   Tonny Bollman, MD or Tereso Newcomer, PA-C

## 2020-08-22 ENCOUNTER — Other Ambulatory Visit: Payer: Self-pay

## 2020-08-22 ENCOUNTER — Other Ambulatory Visit: Payer: PRIVATE HEALTH INSURANCE | Admitting: *Deleted

## 2020-08-22 DIAGNOSIS — I10 Essential (primary) hypertension: Secondary | ICD-10-CM

## 2020-08-22 DIAGNOSIS — E782 Mixed hyperlipidemia: Secondary | ICD-10-CM

## 2020-08-22 LAB — LIPID PANEL
Chol/HDL Ratio: 2.8 ratio (ref 0.0–5.0)
Cholesterol, Total: 141 mg/dL (ref 100–199)
HDL: 50 mg/dL (ref >39)
LDL Chol Calc (NIH): 68 mg/dL (ref 0–99)
Triglycerides: 131 mg/dL (ref 0–149)
VLDL Cholesterol Cal: 23 mg/dL (ref 5–40)

## 2020-08-22 LAB — COMPREHENSIVE METABOLIC PANEL
ALT: 22 IU/L (ref 0–44)
AST: 23 IU/L (ref 0–40)
Albumin/Globulin Ratio: 2.4 — ABNORMAL HIGH (ref 1.2–2.2)
Albumin: 4.3 g/dL (ref 3.8–4.9)
Alkaline Phosphatase: 59 IU/L (ref 44–121)
BUN/Creatinine Ratio: 9 (ref 9–20)
BUN: 11 mg/dL (ref 6–24)
Bilirubin Total: 0.7 mg/dL (ref 0.0–1.2)
CO2: 24 mmol/L (ref 20–29)
Calcium: 9 mg/dL (ref 8.7–10.2)
Chloride: 104 mmol/L (ref 96–106)
Creatinine, Ser: 1.16 mg/dL (ref 0.76–1.27)
GFR calc Af Amer: 79 mL/min/{1.73_m2} (ref >59)
GFR calc non Af Amer: 69 mL/min/{1.73_m2} (ref >59)
Globulin, Total: 1.8 g/dL (ref 1.5–4.5)
Glucose: 124 mg/dL — ABNORMAL HIGH (ref 65–99)
Potassium: 4.3 mmol/L (ref 3.5–5.2)
Sodium: 141 mmol/L (ref 134–144)
Total Protein: 6.1 g/dL (ref 6.0–8.5)

## 2020-08-24 ENCOUNTER — Telehealth: Payer: Self-pay | Admitting: Physician Assistant

## 2020-08-24 NOTE — Telephone Encounter (Signed)
Patient returning call. He states if he does not answer to leave a message with the recommendation.

## 2020-08-24 NOTE — Telephone Encounter (Signed)
Pt aware of lab results ./cy 

## 2020-08-24 NOTE — Telephone Encounter (Signed)
Glucose elevated. Creatinine, K+, LFTs normal. Lipids optimal. PLAN:  - Continue current medications/treatment plan and follow up as scheduled.  - F/u with PCP for hyperglycemia. Send copy of labs to PCP.  Tereso Newcomer, PA-C   08/23/2020 9:41 AM

## 2020-09-23 ENCOUNTER — Other Ambulatory Visit: Payer: Self-pay | Admitting: Cardiovascular Disease

## 2020-11-24 ENCOUNTER — Other Ambulatory Visit: Payer: Self-pay | Admitting: Cardiovascular Disease

## 2020-11-27 ENCOUNTER — Ambulatory Visit (HOSPITAL_COMMUNITY)
Admission: EM | Admit: 2020-11-27 | Discharge: 2020-11-27 | Disposition: A | Discharge status: Home / Self Care | Payer: PRIVATE HEALTH INSURANCE

## 2020-11-27 ENCOUNTER — Encounter (HOSPITAL_COMMUNITY): Payer: Self-pay

## 2020-11-27 DIAGNOSIS — Z8616 Personal history of COVID-19: Secondary | ICD-10-CM

## 2020-11-27 DIAGNOSIS — J069 Acute upper respiratory infection, unspecified: Secondary | ICD-10-CM | POA: Diagnosis not present

## 2020-11-27 MED ORDER — PREDNISONE 20 MG PO TABS: 40.0000 mg | ORAL_TABLET | Freq: Every day | ORAL | 0 refills | Status: AC

## 2020-11-27 MED ORDER — PROMETHAZINE-DM 6.25-15 MG/5ML PO SYRP: 5.0000 mL | ORAL_SOLUTION | Freq: Four times a day (QID) | ORAL | 0 refills | Status: DC | PRN

## 2020-11-27 MED ORDER — BENZONATATE 100 MG PO CAPS: 100.0000 mg | ORAL_CAPSULE | Freq: Three times a day (TID) | ORAL | 0 refills | Status: DC

## 2020-11-27 NOTE — Discharge Instructions (Addendum)
-  Prednisone, 2 pills taken at the same time for 5 days in a row.  Try taking this earlier in the day as it can give you energy.  -Promethazine DM cough syrup for congestion/cough. This could make you drowsy, so take at night before bed. -Tessalon (Benzonatate) as needed for cough. Take one pill up to 3x daily (every 8 hours) -For fevers/chills, bodyaches, headaches- Take Tylenol 1000 mg 3 times daily, and ibuprofen 800 mg 3 times daily with food.  You can take these together, or alternate every 3-4 hours. -With a virus, you're typically contagious for 5-7 days, or as long as you're having fevers.  -Seek additional medical attention if you develop new symptoms like shortness of breath, chest pain, dizziness, etc.

## 2020-11-27 NOTE — ED Triage Notes (Signed)
Pt reports negative covid test around Friday. States began having sore throat, cough worsening in late afternoon/ evening. States symptoms started Thursday.

## 2020-11-27 NOTE — ED Provider Notes (Signed)
[facility]    CSN: [id number] Arrival date & time: 11/27/20  1017      History   Chief Complaint Chief Complaint  Patient presents with  . Cough  . Sore Throat    HPI Alexander Lamb is a 60 y.o. male presenting with 4 days of viral symptoms.  Medical history allergies, arthritis, carotid stenosis, hypertension, hyperlipidemia.  Notes four days throat, cough.  Cough is nonproductive but worse at night, keeping him up at night.  Denies fever/chills, states his temperature is ranging 97-98 at home.  Rapid COVID test was negative on day 2 of symptoms.  States his wife is also sick and also tested negative for COVID.  He had COVID-19 5 months ago.  Fully vaccinated and double boosted for COVID. Denies fevers/chills, n/v/d, shortness of breath, chest pain, facial pain, teeth pain, headaches, sore throat, loss of taste/smell, swollen lymph nodes, ear pain, wheezing, dizziness. Denies pulmonary disease. States his wife is being treated with steroid for her cough.   HPI  Past Medical History:  Diagnosis Date  . Allergy   . Arthritis   . Carotid stenosis    a.  Carotid US (05/2013): Bilateral 40-59% ICA stenosis (F/u 1 year);  b.  Carotid US (11/15):  No ICA stenosis  . Hx of echocardiogram    Echo (11/14):  EF 55-60%, normal LV thickness, Gr 1 DD, mild LAE  . Hyperlipidemia   . Hypertension     Patient Active Problem List   Diagnosis Date Noted  . HTN (hypertension) 06/09/2013  . Hyperlipidemia, mixed 01/01/2013    Past Surgical History:  Procedure Laterality Date  . left shoulder repair    . TONSILLECTOMY         Home Medications    Prior to Admission medications   Medication Sig Start Date End Date Taking? Authorizing Provider  amLODipine (NORVASC) 5 MG tablet Take 1 tablet (5 mg total) by mouth daily. 08/01/20  Yes Weaver, Scott T, PA-C  Aspirin-Acetaminophen-Caffeine (EXCEDRIN PO) Take 1 tablet by mouth as needed.   Yes [provider]   benzonatate (TESSALON) 100 MG capsule Take 1 capsule (100 mg total) by mouth every 8 (eight) hours. 11/27/20  Yes Rhys Martini, PA-C  CIALIS 5 MG tablet Take 5 mg by mouth daily as needed. Reported on 09/16/2015 01/10/15  Yes [provider]  clomiPHENE (CLOMID) 50 MG tablet Take by mouth daily.   Yes [provider]  lisinopril (ZESTRIL) 40 MG tablet TAKE 1 TABLET BY MOUTH DAILY 11/24/20  Yes Weaver, Scott T, PA-C  predniSONE (DELTASONE) 20 MG tablet Take 2 tablets (40 mg total) by mouth daily for 5 days. 11/27/20 12/02/20 Yes Rhys Martini, PA-C  promethazine-dextromethorphan (PROMETHAZINE-DM) 6.25-15 MG/5ML syrup Take 5 mLs by mouth 4 (four) times daily as needed for cough. 11/27/20  Yes Rhys Martini, PA-C  rosuvastatin (CRESTOR) 10 MG tablet TAKE 1 TABLET BY MOUTH DAILY 09/23/20  Yes Tonny Bollman, MD  tamsulosin (FLOMAX) 0.4 MG CAPS capsule Take 0.4 mg by mouth.   Yes [provider]  famotidine (PEPCID) 40 MG tablet Take 40 mg by mouth at bedtime. 09/07/20 11/27/20 Yes [provider]    Family History Family History  Problem Relation Age of Onset  . Arthritis Mother   . COPD Mother   . Fibromyalgia Mother   . Cancer Maternal Grandmother     Social History Social History   Tobacco Use  . Smoking status: Never Smoker  .  Smokeless tobacco: Never Used  Vaping Use  . Vaping Use: Never used  Substance Use Topics  . Alcohol use: Yes    Comment: 1-2 glasses wine most days     Allergies   Sulfa antibiotics and Oxycodone   Review of Systems Review of Systems  Constitutional: Negative for appetite change, chills and fever.  HENT: Positive for congestion. Negative for ear pain, rhinorrhea, sinus pressure, sinus pain and sore throat.   Eyes: Negative for redness and visual disturbance.  Respiratory: Positive for cough. Negative for chest tightness, shortness of breath and wheezing.   Cardiovascular: Negative for chest pain and palpitations.   Gastrointestinal: Negative for abdominal pain, constipation, diarrhea, nausea and vomiting.  Genitourinary: Negative for dysuria, frequency and urgency.  Musculoskeletal: Negative for myalgias.  Neurological: Negative for dizziness, weakness and headaches.  Psychiatric/Behavioral: Negative for confusion.  All other systems reviewed and are negative.    Physical Exam Triage Vital Signs ED Triage Vitals  Enc Vitals Group     BP 11/27/20 1140 133/69     Pulse Rate 11/27/20 1140 70     Resp 11/27/20 1140 18     Temp 11/27/20 1140 98.5 F (36.9 C)     Temp src --      SpO2 11/27/20 1140 99 %     Weight --      Height --      Head Circumference --      Peak Flow --      Pain Score 11/27/20 1133 3     Pain Loc --      Pain Edu? --      Excl. in GC? --    No data found.  Updated Vital Signs BP 133/69   Pulse 70   Temp 98.5 F (36.9 C)   Resp 18   SpO2 99%   Visual Acuity Right Eye Distance:   Left Eye Distance:   Bilateral Distance:    Right Eye Near:   Left Eye Near:    Bilateral Near:     Physical Exam Vitals reviewed.  Constitutional:      General: He is not in acute distress.    Appearance: Normal appearance. He is not ill-appearing.  HENT:     Head: Normocephalic and atraumatic.     Right Ear: Hearing, tympanic membrane, ear canal and external ear normal. No swelling or tenderness. There is no impacted cerumen. No mastoid tenderness. Tympanic membrane is not perforated, erythematous, retracted or bulging.     Left Ear: Hearing, tympanic membrane, ear canal and external ear normal. No swelling or tenderness. There is no impacted cerumen. No mastoid tenderness. Tympanic membrane is not perforated, erythematous, retracted or bulging.     Nose:     Right Sinus: No maxillary sinus tenderness or frontal sinus tenderness.     Left Sinus: No maxillary sinus tenderness or frontal sinus tenderness.     Mouth/Throat:     Mouth: Mucous membranes are moist.      Pharynx: Uvula midline. No oropharyngeal exudate or posterior oropharyngeal erythema.     Tonsils: No tonsillar exudate.  Cardiovascular:     Rate and Rhythm: Normal rate and regular rhythm.     Heart sounds: Normal heart sounds.  Pulmonary:     Breath sounds: Normal breath sounds and air entry. No wheezing, rhonchi or rales.     Comments: Frequent hacking cough Chest:     Chest wall: No tenderness.  Abdominal:     General: Abdomen is  flat. Bowel sounds are normal.     Tenderness: There is no abdominal tenderness. There is no guarding or rebound.  Lymphadenopathy:     Cervical: No cervical adenopathy.  Neurological:     General: No focal deficit present.     Mental Status: He is alert and oriented to person, place, and time.  Psychiatric:        Attention and Perception: Attention and perception normal.        Mood and Affect: Mood and affect normal.        Behavior: Behavior normal. Behavior is cooperative.        Thought Content: Thought content normal.        Judgment: Judgment normal.      UC Treatments / Results  Labs (all labs ordered are listed, but only abnormal results are displayed) Labs Reviewed - No data to display  EKG   Radiology No results found.  Procedures Procedures (including critical care time)  Medications Ordered in UC Medications - No data to display  Initial Impression / Assessment and Plan / UC Course  I have reviewed the triage vital signs and the nursing notes.  Pertinent labs & imaging results that were available during my care of the patient were reviewed by me and considered in my medical decision making (see chart for details).     This patient is a 60 year old male presenting with viral URI with cough. Today this pt is afebrile nontachycardic nontachypneic, oxygenating well on room air, no wheezes rhonchi or rales.   Negative rapid covid test on day 2 of symptoms. Declines additional testing today. Fully vaccinated for covid 19 and  double boosted. States he had covid19 twice in the past.   Prednisone, promethazine, tessalon as below. He does not have diabetes.   Work note provided.  ED return precautions discussed.   Final Clinical Impressions(s) / UC Diagnoses   Final diagnoses:  Viral URI with cough  History of COVID-19     Discharge Instructions     -Prednisone, 2 pills taken at the same time for 5 days in a row.  Try taking this earlier in the day as it can give you energy.  -Promethazine DM cough syrup for congestion/cough. This could make you drowsy, so take at night before bed. -Tessalon (Benzonatate) as needed for cough. Take one pill up to 3x daily (every 8 hours) -For fevers/chills, bodyaches, headaches- Take Tylenol 1000 mg 3 times daily, and ibuprofen 800 mg 3 times daily with food.  You can take these together, or alternate every 3-4 hours. -With a virus, you're typically contagious for 5-7 days, or as long as you're having fevers.  -Seek additional medical attention if you develop new symptoms like shortness of breath, chest pain, dizziness, etc.     ED Prescriptions    Medication Sig Dispense Auth. Provider   predniSONE (DELTASONE) 20 MG tablet Take 2 tablets (40 mg total) by mouth daily for 5 days. 10 tablet Rhys Martini, PA-C   promethazine-dextromethorphan (PROMETHAZINE-DM) 6.25-15 MG/5ML syrup Take 5 mLs by mouth 4 (four) times daily as needed for cough. 118 mL Rhys Martini, PA-C   benzonatate (TESSALON) 100 MG capsule Take 1 capsule (100 mg total) by mouth every 8 (eight) hours. 21 capsule Rhys Martini, PA-C     PDMP not reviewed this encounter.   Rhys Martini, PA-C 11/27/20 1218

## 2020-12-02 ENCOUNTER — Ambulatory Visit
Admission: EM | Admit: 2020-12-02 | Discharge: 2020-12-02 | Disposition: A | Discharge status: Home / Self Care | Payer: PRIVATE HEALTH INSURANCE

## 2020-12-02 ENCOUNTER — Other Ambulatory Visit: Payer: Self-pay

## 2020-12-02 DIAGNOSIS — J069 Acute upper respiratory infection, unspecified: Secondary | ICD-10-CM

## 2020-12-02 NOTE — Discharge Instructions (Addendum)
-  Continue current regimen -Seek additional medical attention if you develop new symptoms like new fever/chills, shortness of breath, dizziness, chest pain, weakness

## 2020-12-02 NOTE — ED Triage Notes (Signed)
Pt was seen at the [facility] 5 days ago for cough and sore throat. He was tx with prednisone and promethazine with an improvement in his sxs until this morning when he woke up with sore throat, cough and "irritated" bilateral ears, right greater than left. Denies n/v/d. Denies rashes.

## 2020-12-02 NOTE — ED Provider Notes (Signed)
[facility]    CSN: [id number] Arrival date & time: 12/02/20  1623      History   Chief Complaint Chief Complaint  Patient presents with  . Cough  . Sore Throat    HPI BRITAIN SABER is a 60 y.o. male presenting with continued URI symptoms for 9 days.  He was last seen at our other facility 5 days ago for cough and sore throat.  Medical history allergies, frequent anemia, hypertension, carotid stenosis.  At our last visit he was provided with a work note for 3 full days. Today states that he has been taking the medications that were prescribed (promethazine tessalon and, prednisone) and symptoms have largely improved though he has some bilateral ear pressure now. occ dry cough.. scratchy throat. Denies hearing changes, tinnitus. Negative home covid test on day 2 of symptoms and so declined additional testing at last visit. History of COVID 19 twice in the past. Denies fevers/chills, n/v/d, shortness of breath, chest pain, facial pain, teeth pain, headaches,  loss of taste/smell, swollen lymph nodes, ear pain.       HPI  Past Medical History:  Diagnosis Date  . Allergy   . Arthritis   . Carotid stenosis    a.  Carotid US (05/2013): Bilateral 40-59% ICA stenosis (F/u 1 year);  b.  Carotid US (11/15):  No ICA stenosis  . Hx of echocardiogram    Echo (11/14):  EF 55-60%, normal LV thickness, Gr 1 DD, mild LAE  . Hyperlipidemia   . Hypertension     Patient Active Problem List   Diagnosis Date Noted  . HTN (hypertension) 06/09/2013  . Hyperlipidemia, mixed 01/01/2013    Past Surgical History:  Procedure Laterality Date  . left shoulder repair    . TONSILLECTOMY         Home Medications    Prior to Admission medications   Medication Sig Start Date End Date Taking? Authorizing Provider  amLODipine (NORVASC) 5 MG tablet Take 1 tablet (5 mg total) by mouth daily. 08/01/20   Tereso Newcomer T, PA-C  Aspirin-Acetaminophen-Caffeine (EXCEDRIN PO) Take 1 tablet  by mouth as needed.    [provider]  benzonatate (TESSALON) 100 MG capsule Take 1 capsule (100 mg total) by mouth every 8 (eight) hours. 11/27/20   Rhys Martini, PA-C  CIALIS 5 MG tablet Take 5 mg by mouth daily as needed. Reported on 09/16/2015 01/10/15   [provider]  clomiPHENE (CLOMID) 50 MG tablet Take by mouth daily.    [provider]  lisinopril (ZESTRIL) 40 MG tablet TAKE 1 TABLET BY MOUTH DAILY 11/24/20   Tereso Newcomer T, PA-C  predniSONE (DELTASONE) 20 MG tablet Take 2 tablets (40 mg total) by mouth daily for 5 days. 11/27/20 12/02/20  Rhys Martini, PA-C  promethazine-dextromethorphan (PROMETHAZINE-DM) 6.25-15 MG/5ML syrup Take 5 mLs by mouth 4 (four) times daily as needed for cough. 11/27/20   Rhys Martini, PA-C  rosuvastatin (CRESTOR) 10 MG tablet TAKE 1 TABLET BY MOUTH DAILY 09/23/20   Tonny Bollman, MD  tamsulosin (FLOMAX) 0.4 MG CAPS capsule Take 0.4 mg by mouth.    [provider]  famotidine (PEPCID) 40 MG tablet Take 40 mg by mouth at bedtime. 09/07/20 11/27/20  [provider]    Family History Family History  Problem Relation Age of Onset  . Arthritis Mother   . COPD Mother   . Fibromyalgia Mother   . Cancer Maternal Grandmother  Social History Social History   Tobacco Use  . Smoking status: Never Smoker  . Smokeless tobacco: Never Used  Vaping Use  . Vaping Use: Never used  Substance Use Topics  . Alcohol use: Yes    Comment: 1-2 glasses wine most days     Allergies   Sulfa antibiotics and Oxycodone   Review of Systems Review of Systems  Constitutional: Negative for appetite change, chills and fever.  HENT: Positive for congestion and sore throat. Negative for ear pain, rhinorrhea, sinus pressure and sinus pain.   Eyes: Negative for redness and visual disturbance.  Respiratory: Positive for cough. Negative for chest tightness, shortness of breath and wheezing.   Cardiovascular: Negative for chest  pain and palpitations.  Gastrointestinal: Negative for abdominal pain, constipation, diarrhea, nausea and vomiting.  Genitourinary: Negative for dysuria, frequency and urgency.  Musculoskeletal: Negative for myalgias.  Neurological: Negative for dizziness, weakness and headaches.  Psychiatric/Behavioral: Negative for confusion.  All other systems reviewed and are negative.    Physical Exam Triage Vital Signs ED Triage Vitals  Enc Vitals Group     BP 12/02/20 1702 (!) 144/80     Pulse Rate 12/02/20 1702 67     Resp 12/02/20 1702 18     Temp 12/02/20 1702 98.1 F (36.7 C)     Temp Source 12/02/20 1702 Oral     SpO2 12/02/20 1702 96 %     Weight --      Height --      Head Circumference --      Peak Flow --      Pain Score 12/02/20 1706 2     Pain Loc --      Pain Edu? --      Excl. in GC? --    No data found.  Updated Vital Signs BP (!) 144/80 (BP Location: Left Arm)   Pulse 67   Temp 98.1 F (36.7 C) (Oral)   Resp 18   SpO2 96%   Visual Acuity Right Eye Distance:   Left Eye Distance:   Bilateral Distance:    Right Eye Near:   Left Eye Near:    Bilateral Near:     Physical Exam Vitals reviewed.  Constitutional:      General: He is not in acute distress.    Appearance: Normal appearance. He is not ill-appearing.  HENT:     Head: Normocephalic and atraumatic.     Right Ear: Hearing, tympanic membrane, ear canal and external ear normal. No swelling or tenderness. There is no impacted cerumen. No mastoid tenderness. Tympanic membrane is not perforated, erythematous, retracted or bulging.     Left Ear: Hearing, tympanic membrane, ear canal and external ear normal. No swelling or tenderness. There is no impacted cerumen. No mastoid tenderness. Tympanic membrane is not perforated, erythematous, retracted or bulging.     Nose:     Right Sinus: No maxillary sinus tenderness or frontal sinus tenderness.     Left Sinus: No maxillary sinus tenderness or frontal sinus  tenderness.     Mouth/Throat:     Mouth: Mucous membranes are moist.     Pharynx: Uvula midline. No oropharyngeal exudate or posterior oropharyngeal erythema.     Tonsils: No tonsillar exudate.  Cardiovascular:     Rate and Rhythm: Normal rate and regular rhythm.     Heart sounds: Normal heart sounds.  Pulmonary:     Breath sounds: Normal breath sounds and air entry. No wheezing, rhonchi or rales.  Chest:     Chest wall: No tenderness.  Abdominal:     General: Abdomen is flat. Bowel sounds are normal.     Tenderness: There is no abdominal tenderness. There is no guarding or rebound.  Lymphadenopathy:     Cervical: No cervical adenopathy.  Neurological:     General: No focal deficit present.     Mental Status: He is alert and oriented to person, place, and time.  Psychiatric:        Attention and Perception: Attention and perception normal.        Mood and Affect: Mood and affect normal.        Behavior: Behavior normal. Behavior is cooperative.        Thought Content: Thought content normal.        Judgment: Judgment normal.      UC Treatments / Results  Labs (all labs ordered are listed, but only abnormal results are displayed) Labs Reviewed - No data to display  EKG   Radiology No results found.  Procedures Procedures (including critical care time)  Medications Ordered in UC Medications - No data to display  Initial Impression / Assessment and Plan / UC Course  I have reviewed the triage vital signs and the nursing notes.  Pertinent labs & imaging results that were available during my care of the patient were reviewed by me and considered in my medical decision making (see chart for details).     This patient is a 60 year old male presenting with viral URI with cough x9 days, largely improved. today desirous of work note. Today this pt is afebrile nontachycardic nontachypneic, oxygenating well on room air, no wheezes rhonchi or rales. benign exam.  Negative  rapid covid test on day 2 of symptoms. Declines additional testing today. Fully vaccinated for covid 19 and double boosted. States he had covid19 twice in the past.   Was prescribed Prednisone, promethazine, tessalon last visit, continue this.  As he has had symptoms >9 days and very benign exam today, I do not feel that another work note is warranted today. patient verbalizes understanding.   ED return precautions discussed.   Final Clinical Impressions(s) / UC Diagnoses   Final diagnoses:  Viral URI with cough     Discharge Instructions     -Continue current regimen -Seek additional medical attention if you develop new symptoms like new fever/chills, shortness of breath, dizziness, chest pain, weakness    ED Prescriptions    None     PDMP not reviewed this encounter.   Rhys Martini, PA-C 12/02/20 1815

## 2021-08-03 ENCOUNTER — Other Ambulatory Visit: Payer: Self-pay | Admitting: Physician Assistant

## 2021-08-30 ENCOUNTER — Other Ambulatory Visit: Payer: Self-pay | Admitting: Cardiovascular Disease

## 2021-09-05 ENCOUNTER — Other Ambulatory Visit: Payer: Self-pay | Admitting: Physician Assistant

## 2021-09-21 DIAGNOSIS — Z8249 Family history of ischemic heart disease and other diseases of the circulatory system: Secondary | ICD-10-CM | POA: Insufficient documentation

## 2021-09-21 NOTE — Progress Notes (Signed)
?Cardiology Office Note:   ? ?Date:  09/22/2021  ? ?ID:  TIELER COURNOYER, DOB 07-10-1960, MRN [id number] ? ?PCP:  Posey Boyer, MD (Inactive)  ?[facility] Providers ?Cardiologist:  Sherren Mocha, MD ?Cardiology APP:  Liliane Shi, PA-C    ?Referring MD: No ref. provider found  ? ?Chief Complaint:  Follow-up for HTN, high cholesterol ?  ? ?Patient Profile: ?Hypertension ?Hyperlipidemia ?History of carotid artery stenosis ?Carotid US 11/14: Bilateral ICA 40-59 ?Carotid US 11/15: No ICA stenosis ?Carotid artery infundibula ?Head CTA 12/19: small b/l supraclinoid ICA outpouchings (favor infundibula over aneurysms) ?No f/u recommended  ?Hypogonadism ? ?Prior CV Studies: ?Carotid US 11/15 ?No ICA stenosis ?  ? ?History of Present Illness:   ?Alexander Lamb is a 61 y.o. male with the above problem list.  He was last seen in 1/22 and returns for routine f/u.  He is here alone.  He is doing well without chest pain, shortness of breath, syncope, orthopnea, leg edema.     ?   ?Past Medical History:  ?Diagnosis Date  ? Allergy   ? Arthritis   ? Carotid stenosis   ? a.  Carotid US (05/2013): Bilateral 40-59% ICA stenosis (F/u 1 year);  b.  Carotid US (11/15):  No ICA stenosis  ? Hx of echocardiogram   ? Echo (11/14):  EF 55-60%, normal LV thickness, Gr 1 DD, mild LAE  ? Hyperlipidemia   ? Hypertension   ? ?Current Medications: ?Current Meds  ?Medication Sig  ? amLODipine (NORVASC) 5 MG tablet TAKE 1 TABLET BY MOUTH DAILY  ? Aspirin-Acetaminophen-Caffeine (EXCEDRIN PO) Take 1 tablet by mouth as needed.  ? benzonatate (TESSALON) 100 MG capsule Take 1 capsule (100 mg total) by mouth every 8 (eight) hours.  ? CIALIS 5 MG tablet Take 5 mg by mouth daily as needed. Reported on 09/16/2015  ? clomiPHENE (CLOMID) 50 MG tablet Take by mouth daily.  ? famotidine (PEPCID) 40 MG tablet TAKE 1 TABLET BY MOUTH EACH NIGHT AT BEDTIME ?Strength: 40 mg  ? lisinopril (ZESTRIL) 40 MG tablet TAKE 1 TABLET BY MOUTH DAILY  ?  promethazine-dextromethorphan (PROMETHAZINE-DM) 6.25-15 MG/5ML syrup Take 5 mLs by mouth 4 (four) times daily as needed for cough.  ? rosuvastatin (CRESTOR) 10 MG tablet TAKE 1 TABLET BY MOUTH DAILY  ? tamsulosin (FLOMAX) 0.4 MG CAPS capsule Take 0.4 mg by mouth.  ?  ?Allergies:   Sulfa antibiotics and Oxycodone  ? ?Social History  ? ?Tobacco Use  ? Smoking status: Never  ? Smokeless tobacco: Never  ?Vaping Use  ? Vaping Use: Never used  ?Substance Use Topics  ? Alcohol use: Yes  ?  Comment: 1-2 glasses wine most days  ?  ?Family Hx: ?The patient's family history includes Arthritis in his mother; COPD in his mother; Cancer in his maternal grandmother; Fibromyalgia in his mother. ? ?Review of Systems  ?Constitutional: Negative for fever.  ?Respiratory:  Negative for cough.    ? ?EKGs/Labs/Other Test Reviewed:   ? ?EKG:  EKG is  ordered today.  The ekg ordered today demonstrates NSR, HR 75, left axis deviation, no ST-T wave changes, QTc 433 ? ?Recent Labs: ?No results found for requested labs within last 8760 hours.  ? ?Recent Lipid Panel ?No results for input(s): CHOL, TRIG, HDL, VLDL, LDLCALC, LDLDIRECT in the last 8760 hours.  ? ?Risk Assessment/Calculations:   ?  ?    ?Physical Exam:   ? ?VS:  BP 132/62 (BP Location: Right Arm,  Patient Position: Sitting, Cuff Size: Large)   Pulse 75   Ht '5\' 6"'  (1.676 m)   Wt 220 lb 6.4 oz (100 kg)   SpO2 98%   BMI 35.57 kg/m?    ? ?Wt Readings from Last 3 Encounters:  ?09/22/21 220 lb 6.4 oz (100 kg)  ?08/01/20 215 lb (97.5 kg)  ?07/14/19 226 lb 3.2 oz (102.6 kg)  ?  ?Constitutional:   ?   Appearance: Healthy appearance. Not in distress.  ?Neck:  ?   Vascular: No JVR. JVD normal.  ?Pulmonary:  ?   Effort: Pulmonary effort is normal.  ?   Breath sounds: No wheezing. No rales.  ?Cardiovascular:  ?   Normal rate. Regular rhythm. Normal S1. Normal S2.   ?   Murmurs: There is no murmur.  ?Edema: ?   Peripheral edema absent.  ?Abdominal:  ?   Palpations: Abdomen is soft.  ?Skin: ?    General: Skin is warm and dry.  ?Neurological:  ?   General: No focal deficit present.  ?   Mental Status: Alert and oriented to person, place and time.  ?   Cranial Nerves: Cranial nerves are intact.  ?  ?    ?ASSESSMENT & PLAN:   ?Essential hypertension ?Repeat blood pressure by me 128/80.  Blood pressure is well controlled on amlodipine, lisinopril.  Continue current therapy.  Follow-up 1 year. ? ?Hyperlipidemia, mixed ?He remains on Crestor 10 mg daily.  Arrange fasting CMET, lipids ? ?Family history of early CAD ?No anginal symptoms.  Continue risk factor modification. ? ?     ?   ?Dispo:  Return in about 1 year (around 09/23/2022) for Routine Follow Up, w/ Dr. Burt Knack, or Richardson Dopp, PA-C.  ? ?Medication Adjustments/Labs and Tests Ordered: ?Current medicines are reviewed at length with the patient today.  Concerns regarding medicines are outlined above.  ?Tests Ordered: ?Orders Placed This Encounter  ?Procedures  ? Lipid panel  ? Comp Met (CMET)  ? EKG 12-Lead  ? ?Medication Changes: ?No orders of the defined types were placed in this encounter. ? ?Signed, ?Richardson Dopp, PA-C  ?09/22/2021 12:00 PM    ?[facility] ?[address], [place], [place]  [id number] ?Phone: [phone]; Fax: [phone]  ?

## 2021-09-22 ENCOUNTER — Other Ambulatory Visit: Payer: Self-pay

## 2021-09-22 ENCOUNTER — Encounter: Payer: Self-pay | Admitting: Physician Assistant

## 2021-09-22 ENCOUNTER — Ambulatory Visit (INDEPENDENT_AMBULATORY_CARE_PROVIDER_SITE_OTHER): Payer: BC Managed Care – PPO | Admitting: Physician Assistant

## 2021-09-22 VITALS — BP 132/62 | HR 75 | Ht 66.0 in | Wt 220.4 lb

## 2021-09-22 DIAGNOSIS — I10 Essential (primary) hypertension: Secondary | ICD-10-CM | POA: Diagnosis not present

## 2021-09-22 DIAGNOSIS — E782 Mixed hyperlipidemia: Secondary | ICD-10-CM | POA: Diagnosis not present

## 2021-09-22 DIAGNOSIS — Z8249 Family history of ischemic heart disease and other diseases of the circulatory system: Secondary | ICD-10-CM

## 2021-09-22 NOTE — Assessment & Plan Note (Signed)
No anginal symptoms.  Continue risk factor modification. ?

## 2021-09-22 NOTE — Patient Instructions (Addendum)
edication Instructions:  ?Your physician recommends that you continue on your current medications as directed. Please refer to the Current Medication list given to you today. ? ?*If you need a refill on your cardiac medications before your next appointment, please call your pharmacy* ? ? ?Lab Work: ?COME BACK ONE DAY FOR FASTING:  CMET & LIPID (you can come Monday - Friday anytime from 7:15 - 5:00, just make sure you're fasting. ? ?If you have labs (blood work) drawn today and your tests are completely normal, you will receive your results only by: ?MyChart Message (if you have MyChart) OR ?A paper copy in the mail ?If you have any lab test that is abnormal or we need to change your treatment, we will call you to review the results. ? ? ?Testing/Procedures: ?None ordered ? ? ?Follow-Up: ?At [facility], you and your health needs are our priority.  As part of our continuing mission to provide you with exceptional heart care, we have created designated Provider Care Teams.  These Care Teams include your primary Cardiologist (physician) and Advanced Practice Providers (APPs -  Physician Assistants and Nurse Practitioners) who all work together to provide you with the care you need, when you need it. ? ?We recommend signing up for the patient portal called "MyChart".  Sign up information is provided on this After Visit Summary.  MyChart is used to connect with patients for Virtual Visits (Telemedicine).  Patients are able to view lab/test results, encounter notes, upcoming appointments, etc.  Non-urgent messages can be sent to your provider as well.   ?To learn more about what you can do with MyChart, go to [email or web address].   ? ?Your next appointment:   ?12 month(s) ? ?The format for your next appointment:   ?In Person ? ?Provider:   ?Tonny Bollman, MD  or Tereso Newcomer, PA-C       ? ? ?Other Instructions ? ?

## 2021-09-22 NOTE — Assessment & Plan Note (Signed)
He remains on Crestor 10 mg daily.  Arrange fasting CMET, lipids ?

## 2021-09-22 NOTE — Assessment & Plan Note (Signed)
Repeat blood pressure by me 128/80.  Blood pressure is well controlled on amlodipine, lisinopril.  Continue current therapy.  Follow-up 1 year. ?

## 2021-09-30 ENCOUNTER — Other Ambulatory Visit: Payer: Self-pay | Admitting: Cardiovascular Disease

## 2021-12-08 ENCOUNTER — Other Ambulatory Visit: Payer: Self-pay | Admitting: Physician Assistant

## 2022-09-25 ENCOUNTER — Other Ambulatory Visit: Payer: Self-pay | Admitting: Cardiovascular Disease

## 2022-10-30 ENCOUNTER — Other Ambulatory Visit: Payer: Self-pay | Admitting: Cardiovascular Disease

## 2022-11-19 ENCOUNTER — Other Ambulatory Visit: Payer: Self-pay | Admitting: *Deleted

## 2022-11-19 MED ORDER — AMLODIPINE BESYLATE 5 MG PO TABS: 5.0000 mg | ORAL_TABLET | Freq: Every day | ORAL | 0 refills | Status: DC

## 2022-12-03 NOTE — Progress Notes (Unsigned)
  Cardiology Office Note:    Date:  12/04/2022  ID:  [name], DOB 07/16/1960, MRN [id number] PCP: Peyton Najjar, MD (Inactive)  [facility] Providers Cardiologist:  Tonny Bollman, MD Cardiology APP:  Beatrice Lecher, PA-C       Patient Profile:      Hypertension Hyperlipidemia History of carotid artery stenosis Carotid US 11/14: Bilateral ICA 40-59 Carotid US 11/15: No ICA stenosis Carotid artery infundibula Head CTA 12/19: small b/l supraclinoid ICA outpouchings (favor infundibula over aneurysms) No f/u recommended  Hypogonadism         History of Present Illness:   ZIGMOND DISCH is a 62 y.o. male who returns for f/u of HTN. He was last seen 09/22/21. He is here alone. He has not had chest pain, shortness of breath, syncope, orthopnea, leg edema. He has been weight training and jogging for exercise. He has noted some lightheadedness with head position changes. No spinning. He does have a torn RTC and notes neck pain associated. He has not had palpitations.   Review of Systems  Cardiovascular:  Negative for claudication.  Gastrointestinal:  Negative for hematochezia and melena.  Genitourinary:  Negative for hematuria.  See HPI    Studies Reviewed:    EKG: NSR, HR 63, LBBB, RAD, QTc 460  Risk Assessment/Calculations:             Physical Exam:   VS:  BP 126/81   Pulse 63   Ht 5\' 7"  (1.702 m)   Wt 228 lb (103.4 kg)   SpO2 96%   BMI 35.71 kg/m    Wt Readings from Last 3 Encounters:  12/04/22 228 lb (103.4 kg)  09/22/21 220 lb 6.4 oz (100 kg)  08/01/20 215 lb (97.5 kg)    Constitutional:      Appearance: Healthy appearance. Not in distress.  Neck:     Vascular: No carotid bruit. JVD normal.  Pulmonary:     Breath sounds: Normal breath sounds. No wheezing. No rales.  Cardiovascular:     Normal rate. Regular rhythm.     Murmurs: There is no murmur.  Edema:    Peripheral edema absent.  Abdominal:     Palpations: Abdomen is soft.        ASSESSMENT AND PLAN:   LBBB (left bundle branch block) This is new. I reviewed several prior EKGs. He is not having symptoms of angina or shortness of breath.  Echocardiogram  Lexiscan Myoview F/u 8 weeks  Essential hypertension BP controlled. Continue Amlodipine 5 mg once daily, Lisinopril 40 mg once daily. CMET today.  Hyperlipidemia, mixed Continue Crestor 10 mg once daily. CMET, Lipids today.      Informed Consent   Shared Decision Making/Informed Consent The risks [chest pain, shortness of breath, cardiac arrhythmias, dizziness, blood pressure fluctuations, myocardial infarction, stroke/transient ischemic attack, nausea, vomiting, allergic reaction, radiation exposure, metallic taste sensation and life-threatening complications (estimated to be 1 in 10,000)], benefits (risk stratification, diagnosing coronary artery disease, treatment guidance) and alternatives of a nuclear stress test were discussed in detail with Mr. Krishnamoorthy and he agrees to proceed.     Dispo:  Return in about 8 weeks (around 01/29/2023) for Follow up after testing, w/ Dr. Excell Seltzer, or Tereso Newcomer, PA-C.  Signed, Tereso Newcomer, PA-C

## 2022-12-04 ENCOUNTER — Encounter: Payer: Self-pay | Admitting: Physician Assistant

## 2022-12-04 ENCOUNTER — Ambulatory Visit: Payer: BC Managed Care – PPO | Attending: Physician Assistant | Admitting: Physician Assistant

## 2022-12-04 VITALS — BP 126/81 | HR 63 | Ht 67.0 in | Wt 228.0 lb

## 2022-12-04 DIAGNOSIS — E782 Mixed hyperlipidemia: Secondary | ICD-10-CM

## 2022-12-04 DIAGNOSIS — I447 Left bundle-branch block, unspecified: Secondary | ICD-10-CM | POA: Insufficient documentation

## 2022-12-04 DIAGNOSIS — I10 Essential (primary) hypertension: Secondary | ICD-10-CM | POA: Diagnosis not present

## 2022-12-04 LAB — COMPREHENSIVE METABOLIC PANEL
ALT: 25 IU/L (ref 0–44)
AST: 25 IU/L (ref 0–40)
Albumin/Globulin Ratio: 2.4 — ABNORMAL HIGH (ref 1.2–2.2)
Albumin: 4.4 g/dL (ref 3.9–4.9)
Alkaline Phosphatase: 62 IU/L (ref 44–121)
BUN/Creatinine Ratio: 13 (ref 10–24)
BUN: 15 mg/dL (ref 8–27)
Bilirubin Total: 0.6 mg/dL (ref 0.0–1.2)
CO2: 24 mmol/L (ref 20–29)
Calcium: 9.1 mg/dL (ref 8.6–10.2)
Chloride: 106 mmol/L (ref 96–106)
Creatinine, Ser: 1.16 mg/dL (ref 0.76–1.27)
Globulin, Total: 1.8 g/dL (ref 1.5–4.5)
Glucose: 120 mg/dL — ABNORMAL HIGH (ref 70–99)
Potassium: 4 mmol/L (ref 3.5–5.2)
Sodium: 141 mmol/L (ref 134–144)
Total Protein: 6.2 g/dL (ref 6.0–8.5)
eGFR: 72 mL/min/{1.73_m2} (ref >59)

## 2022-12-04 LAB — LIPID PANEL
Chol/HDL Ratio: 2.7 ratio (ref 0.0–5.0)
Cholesterol, Total: 130 mg/dL (ref 100–199)
HDL: 49 mg/dL (ref >39)
LDL Chol Calc (NIH): 63 mg/dL (ref 0–99)
Triglycerides: 93 mg/dL (ref 0–149)
VLDL Cholesterol Cal: 18 mg/dL (ref 5–40)

## 2022-12-04 MED ORDER — LISINOPRIL 40 MG PO TABS: 40.0000 mg | ORAL_TABLET | Freq: Every day | ORAL | 3 refills | Status: DC

## 2022-12-04 MED ORDER — AMLODIPINE BESYLATE 5 MG PO TABS: 5.0000 mg | ORAL_TABLET | Freq: Every day | ORAL | 3 refills | Status: DC

## 2022-12-04 NOTE — Assessment & Plan Note (Signed)
This is new. I reviewed several prior EKGs. He is not having symptoms of angina or shortness of breath.  Echocardiogram  Lexiscan Myoview F/u 8 weeks

## 2022-12-04 NOTE — Assessment & Plan Note (Signed)
BP controlled. Continue Amlodipine 5 mg once daily, Lisinopril 40 mg once daily. CMET today.

## 2022-12-04 NOTE — Patient Instructions (Signed)
Medication Instructions:  Your physician recommends that you continue on your current medications as directed. Please refer to the Current Medication list given to you today.  *If you need a refill on your cardiac medications before your next appointment, please call your pharmacy*   Lab Work: TODAY:  CMET & LIPID  If you have labs (blood work) drawn today and your tests are completely normal, you will receive your results only by: MyChart Message (if you have MyChart) OR A paper copy in the mail If you have any lab test that is abnormal or we need to change your treatment, we will call you to review the results.   Testing/Procedures: Your physician has requested that you have an echocardiogram. Echocardiography is a painless test that uses sound waves to create images of your heart. It provides your doctor with information about the size and shape of your heart and how well your heart's chambers and valves are working. This procedure takes approximately one hour. There are no restrictions for this procedure. Please do NOT wear cologne, perfume, aftershave, or lotions (deodorant is allowed). Please arrive 15 minutes prior to your appointment time.   Your physician has requested that you have a lexiscan myoview. For further information please visit [email or web address] Please follow instruction sheet, BELOW:    You are scheduled for a Myocardial Perfusion Imaging Study Please arrive 15 minutes prior to your appointment time for registration and insurance purposes.  The test will take approximately 3 to 4 hours to complete; you may bring reading material.  If someone comes with you to your appointment, they will need to remain in the main lobby due to limited space in the testing area. **If you are pregnant or breastfeeding, please notify the nuclear lab prior to your appointment**  How to prepare for your Myocardial Perfusion Test: Do not eat or drink 3 hours prior to your test, except  you may have water. Do not consume products containing caffeine (regular or decaffeinated) 12 hours prior to your test. (ex: coffee, chocolate, sodas, tea). Do bring a list of your current medications with you.  If not listed below, you may take your medications as normal. Do wear comfortable clothes (no dresses or overalls) and walking shoes, tennis shoes preferred (No heels or open toe shoes are allowed). Do NOT wear cologne, perfume, aftershave, or lotions (deodorant is allowed). If these instructions are not followed, your test will have to be rescheduled.     Follow-Up: At [facility], you and your health needs are our priority.  As part of our continuing mission to provide you with exceptional heart care, we have created designated Provider Care Teams.  These Care Teams include your primary Cardiologist (physician) and Advanced Practice Providers (APPs -  Physician Assistants and Nurse Practitioners) who all work together to provide you with the care you need, when you need it.  We recommend signing up for the patient portal called "MyChart".  Sign up information is provided on this After Visit Summary.  MyChart is used to connect with patients for Virtual Visits (Telemedicine).  Patients are able to view lab/test results, encounter notes, upcoming appointments, etc.  Non-urgent messages can be sent to your provider as well.   To learn more about what you can do with MyChart, go to [email or web address].    Your next appointment:   6-8 week(s) (MAKE SURE AFTER TESTING IS COMPLETE)  Provider:   Tereso Newcomer, PA-C         Other Instructions

## 2022-12-04 NOTE — Assessment & Plan Note (Signed)
Continue Crestor 10 mg once daily. CMET, Lipids today.

## 2022-12-13 ENCOUNTER — Telehealth (HOSPITAL_COMMUNITY): Payer: Self-pay

## 2022-12-13 NOTE — Telephone Encounter (Signed)
Detailed instructions left on the patient's answering machine. Asked to call back with any questions. S.Ellianah Cordy EMTP/CCT 

## 2022-12-14 ENCOUNTER — Ambulatory Visit (HOSPITAL_COMMUNITY): Payer: BC Managed Care – PPO | Attending: Physician Assistant

## 2022-12-14 DIAGNOSIS — I447 Left bundle-branch block, unspecified: Secondary | ICD-10-CM

## 2022-12-14 DIAGNOSIS — E782 Mixed hyperlipidemia: Secondary | ICD-10-CM | POA: Diagnosis present

## 2022-12-14 DIAGNOSIS — I10 Essential (primary) hypertension: Secondary | ICD-10-CM | POA: Diagnosis not present

## 2022-12-14 LAB — ECHOCARDIOGRAM COMPLETE
Area-P 1/2: 3.5 cm2
P 1/2 time: 360 msec
S' Lateral: 3.5 cm

## 2022-12-19 ENCOUNTER — Encounter: Payer: Self-pay | Admitting: Physician Assistant

## 2022-12-19 ENCOUNTER — Ambulatory Visit (HOSPITAL_COMMUNITY): Payer: BC Managed Care – PPO | Attending: Cardiology

## 2022-12-19 DIAGNOSIS — I447 Left bundle-branch block, unspecified: Secondary | ICD-10-CM | POA: Diagnosis present

## 2022-12-19 DIAGNOSIS — I420 Dilated cardiomyopathy: Secondary | ICD-10-CM

## 2022-12-19 DIAGNOSIS — E782 Mixed hyperlipidemia: Secondary | ICD-10-CM | POA: Diagnosis present

## 2022-12-19 DIAGNOSIS — I10 Essential (primary) hypertension: Secondary | ICD-10-CM | POA: Insufficient documentation

## 2022-12-19 DIAGNOSIS — I428 Other cardiomyopathies: Secondary | ICD-10-CM | POA: Insufficient documentation

## 2022-12-19 HISTORY — DX: Dilated cardiomyopathy: I42.0

## 2022-12-19 LAB — MYOCARDIAL PERFUSION IMAGING
LV dias vol: 145 mL (ref 62–150)
LV sys vol: 78 mL
Nuc Stress EF: 46 %
Peak HR: 109 {beats}/min
Rest HR: 66 {beats}/min
Rest Nuclear Isotope Dose: 10.6 mCi
SDS: 4
SRS: 1
SSS: 5
Stress Nuclear Isotope Dose: 32.9 mCi
TID: 1

## 2022-12-19 MED ORDER — REGADENOSON 0.4 MG/5ML IV SOLN
0.4000 mg | Freq: Once | INTRAVENOUS | Status: AC
Start: 2022-12-19 — End: 2022-12-19
  Administered 2022-12-19: 0.4 mg via INTRAVENOUS

## 2022-12-19 MED ORDER — TECHNETIUM TC 99M TETROFOSMIN IV KIT
10.6000 | PACK | Freq: Once | INTRAVENOUS | Status: AC | PRN
  Administered 2022-12-19: 10.6 via INTRAVENOUS

## 2022-12-19 MED ORDER — TECHNETIUM TC 99M TETROFOSMIN IV KIT
32.9000 | PACK | Freq: Once | INTRAVENOUS | Status: AC | PRN
  Administered 2022-12-19: 32.9 via INTRAVENOUS

## 2022-12-21 ENCOUNTER — Ambulatory Visit: Payer: BC Managed Care – PPO | Admitting: Physician Assistant

## 2022-12-28 ENCOUNTER — Encounter: Payer: Self-pay | Admitting: Physician Assistant

## 2022-12-28 ENCOUNTER — Ambulatory Visit: Payer: BC Managed Care – PPO | Admitting: Physician Assistant

## 2022-12-28 ENCOUNTER — Ambulatory Visit: Payer: BC Managed Care – PPO | Attending: Physician Assistant | Admitting: Physician Assistant

## 2022-12-28 ENCOUNTER — Telehealth: Payer: Self-pay | Admitting: Cardiovascular Disease

## 2022-12-28 VITALS — BP 124/73 | HR 70 | Ht 67.0 in | Wt 220.4 lb

## 2022-12-28 DIAGNOSIS — I420 Dilated cardiomyopathy: Secondary | ICD-10-CM

## 2022-12-28 DIAGNOSIS — I10 Essential (primary) hypertension: Secondary | ICD-10-CM | POA: Diagnosis not present

## 2022-12-28 DIAGNOSIS — E782 Mixed hyperlipidemia: Secondary | ICD-10-CM

## 2022-12-28 NOTE — H&P (View-Only) (Signed)
Cardiology Office Note:    Date:  12/28/2022  ID:  Alexander Lamb, DOB 03/05/1961, MRN [id number] PCP: Hopper, David H, MD (Inactive)  [facility] Providers Cardiologist:  Michael Cooper, MD Cardiology APP:  Aleks Nawrot T, PA-C       Patient Profile:      Dilated cardiomyopathy  TTE 12/14/2022: EF 40-45, no RWMA, significant dyssynchrony due to underlying LBBB, normal RVSF, mild LAE, trivial MR, trivial AI, RAP 3 Myoview 12/19/2022: Anteroseptal and apical inferior/anterior and apical infarct, EF 46, no ischemia, intermediate risk Left Bundle Branch Block  Hypertension Hyperlipidemia History of carotid artery stenosis Carotid US 11/14: Bilateral ICA 40-59 Carotid US 11/15: No ICA stenosis Carotid artery infundibula Head CTA 12/19: small b/l supraclinoid ICA outpouchings (favor infundibula over aneurysms) No f/u recommended  Hypogonadism        History of Present Illness:   Alexander Lamb is a 61 y.o. male who returns for follow-up on cardiomyopathy.  He was last seen 12/04/2022.  Symptomatically, he was doing well.  His electrocardiogram demonstrated a new left bundle branch block.  I arranged a nuclear stress test and echocardiogram.  His stress test suggest anteroseptal and apical infarct but no ischemia.  His echocardiogram demonstrates an EF of 40-45.  I reviewed his case with Dr. Cooper.  We have recommended proceeding with cardiac catheterization to further evaluate his cardiomyopathy. He is here alone. He is doing well w/o chest pain, shortness of breath, syncope, leg edema.   Review of Systems  Constitutional: Negative for chills and fever.  Respiratory:  Negative for cough.   Gastrointestinal:  Negative for hematochezia and melena.  Genitourinary:  Negative for hematuria.   See HPI    Studies Reviewed:   EKG Interpretation Date/Time:  Friday December 28 2022 10:58:03 EDT Ventricular Rate:  71 PR Interval:  206 QRS Duration:  154 QT Interval:  420 QTC  Calculation: 456 R Axis:   -64  Text Interpretation: Normal sinus rhythm Left bundle branch block No change when compared to prior ECG Confirmed by Delfin Squillace ([id number]) on 12/28/2022 11:05:53 AM   Risk Assessment/Calculations:           Physical Exam:   VS:  BP 124/73   Pulse 70   Ht 5' 7" (1.702 m)   Wt 220 lb 6.4 oz (100 kg)   SpO2 97%   BMI 34.52 kg/m    Wt Readings from Last 3 Encounters:  12/28/22 220 lb 6.4 oz (100 kg)  12/04/22 228 lb (103.4 kg)  09/22/21 220 lb 6.4 oz (100 kg)    Constitutional:      Appearance: Healthy appearance. Not in distress.  Neck:     Vascular: JVD normal.  Pulmonary:     Breath sounds: Normal breath sounds. No wheezing. No rales.  Cardiovascular:     Normal rate. Regular rhythm.     Murmurs: There is no murmur.  Edema:    Peripheral edema absent.  Abdominal:     Palpations: Abdomen is soft.  Neurological:     General: No focal deficit present.     Mental Status: Alert and oriented to person, place and time.      ASSESSMENT AND PLAN:   Dilated cardiomyopathy (HCC) Pt was recently noted to have a new Left Bundle Branch Block. Echocardiogram shows EF is down at 40-45 and a Myoview suggests prior infarct. He is not have chest pain to suggest angina. He get tired but otherwise feels well. I will set   him up for cardiac catheterization to further evaluate his cardiomyopathy. Continue Lisinopril 40 mg once daily. Consider starting beta-blocker at follow up to advance GDMT.   Essential hypertension BP controlled. Continue Amlodipine 5 mg once daily, Lisinopril 40 mg once daily.    Hyperlipidemia, mixed LDL optimal at 64 earlier this month. Continue Crestor 10 mg once daily.  If he has obstructive coronary artery disease, we will need to increase his statin to achieve an LDL of < 55.     Informed Consent   Shared Decision Making/Informed Consent The risks [stroke (1 in 1000), death (1 in 1000), kidney failure [usually temporary] (1 in 500),  bleeding (1 in 200), allergic reaction [possibly serious] (1 in 200)], benefits (diagnostic support and management of coronary artery disease) and alternatives of a cardiac catheterization were discussed in detail with Mr. Minerva and he is willing to proceed.     Dispo:  Return for Post Procedure Follow Up, w/ Dr. Cooper, or Chawn Spraggins, PA-C.  Signed, Tu Shimmel, PA-C   

## 2022-12-28 NOTE — Assessment & Plan Note (Signed)
Pt was recently noted to have a new Left Bundle Branch Block. Echocardiogram shows EF is down at 40-45 and a Myoview suggests prior infarct. He is not have chest pain to suggest angina. He get tired but otherwise feels well. I will set him up for cardiac catheterization to further evaluate his cardiomyopathy. Continue Lisinopril 40 mg once daily. Consider starting beta-blocker at follow up to advance GDMT.

## 2022-12-28 NOTE — Assessment & Plan Note (Signed)
BP controlled. Continue Amlodipine 5 mg once daily, Lisinopril 40 mg once daily.

## 2022-12-28 NOTE — Progress Notes (Signed)
Cardiology Office Note:    Date:  12/28/2022  ID:  ANTAVIOUS SKELLIE, DOB 13-Jan-1961, MRN [id number] PCP: Peyton Najjar, MD (Inactive)  [facility] Providers Cardiologist:  Tonny Bollman, MD Cardiology APP:  Beatrice Lecher, PA-C       Patient Profile:      Dilated cardiomyopathy  TTE 12/14/2022: EF 40-45, no RWMA, significant dyssynchrony due to underlying LBBB, normal RVSF, mild LAE, trivial MR, trivial AI, RAP 3 Myoview 12/19/2022: Anteroseptal and apical inferior/anterior and apical infarct, EF 46, no ischemia, intermediate risk Left Bundle Branch Block  Hypertension Hyperlipidemia History of carotid artery stenosis Carotid US 11/14: Bilateral ICA 40-59 Carotid US 11/15: No ICA stenosis Carotid artery infundibula Head CTA 12/19: small b/l supraclinoid ICA outpouchings (favor infundibula over aneurysms) No f/u recommended  Hypogonadism        History of Present Illness:   Alexander Lamb is a 62 y.o. male who returns for follow-up on cardiomyopathy.  He was last seen 12/04/2022.  Symptomatically, he was doing well.  His electrocardiogram demonstrated a new left bundle branch block.  I arranged a nuclear stress test and echocardiogram.  His stress test suggest anteroseptal and apical infarct but no ischemia.  His echocardiogram demonstrates an EF of 40-45.  I reviewed his case with Dr. Excell Seltzer.  We have recommended proceeding with cardiac catheterization to further evaluate his cardiomyopathy. He is here alone. He is doing well w/o chest pain, shortness of breath, syncope, leg edema.   Review of Systems  Constitutional: Negative for chills and fever.  Respiratory:  Negative for cough.   Gastrointestinal:  Negative for hematochezia and melena.  Genitourinary:  Negative for hematuria.   See HPI    Studies Reviewed:   EKG Interpretation Date/Time:  Friday December 28 2022 10:58:03 EDT Ventricular Rate:  71 PR Interval:  206 QRS Duration:  154 QT Interval:  420 QTC  Calculation: 456 R Axis:   -64  Text Interpretation: Normal sinus rhythm Left bundle branch block No change when compared to prior ECG Confirmed by Tereso Newcomer [phone]) on 12/28/2022 11:05:53 AM   Risk Assessment/Calculations:           Physical Exam:   VS:  BP 124/73   Pulse 70   Ht 5\' 7"  (1.702 m)   Wt 220 lb 6.4 oz (100 kg)   SpO2 97%   BMI 34.52 kg/m    Wt Readings from Last 3 Encounters:  12/28/22 220 lb 6.4 oz (100 kg)  12/04/22 228 lb (103.4 kg)  09/22/21 220 lb 6.4 oz (100 kg)    Constitutional:      Appearance: Healthy appearance. Not in distress.  Neck:     Vascular: JVD normal.  Pulmonary:     Breath sounds: Normal breath sounds. No wheezing. No rales.  Cardiovascular:     Normal rate. Regular rhythm.     Murmurs: There is no murmur.  Edema:    Peripheral edema absent.  Abdominal:     Palpations: Abdomen is soft.  Neurological:     General: No focal deficit present.     Mental Status: Alert and oriented to person, place and time.      ASSESSMENT AND PLAN:   Dilated cardiomyopathy (HCC) Pt was recently noted to have a new Left Bundle Branch Block. Echocardiogram shows EF is down at 40-45 and a Myoview suggests prior infarct. He is not have chest pain to suggest angina. He get tired but otherwise feels well. I will set  him up for cardiac catheterization to further evaluate his cardiomyopathy. Continue Lisinopril 40 mg once daily. Consider starting beta-blocker at follow up to advance GDMT.   Essential hypertension BP controlled. Continue Amlodipine 5 mg once daily, Lisinopril 40 mg once daily.    Hyperlipidemia, mixed LDL optimal at 64 earlier this month. Continue Crestor 10 mg once daily.  If he has obstructive coronary artery disease, we will need to increase his statin to achieve an LDL of < 55.     Informed Consent   Shared Decision Making/Informed Consent The risks [stroke (1 in 1000), death (1 in 1000), kidney failure [usually temporary] (1 in 500),  bleeding (1 in 200), allergic reaction [possibly serious] (1 in 200)], benefits (diagnostic support and management of coronary artery disease) and alternatives of a cardiac catheterization were discussed in detail with Mr. Alessandro and he is willing to proceed.     Dispo:  Return for Post Procedure Follow Up, w/ Dr. Excell Seltzer, or Tereso Newcomer, PA-C.  Signed, Tereso Newcomer, PA-C

## 2022-12-28 NOTE — Telephone Encounter (Signed)
Patient called wanting to schedule a heart catherization on July 12th if possible.

## 2022-12-28 NOTE — Assessment & Plan Note (Signed)
LDL optimal at 64 earlier this month. Continue Crestor 10 mg once daily.  If he has obstructive coronary artery disease, we will need to increase his statin to achieve an LDL of < 55.

## 2022-12-28 NOTE — Patient Instructions (Addendum)
Medication Instructions:  Your physician recommends that you continue on your current medications as directed. Please refer to the Current Medication list given to you today. *If you need a refill on your cardiac medications before your next appointment, please call your pharmacy*   Lab Work: TODAY:  BMET & CBC  If you have labs (blood work) drawn today and your tests are completely normal, you will receive your results only by: MyChart Message (if you have MyChart) OR A paper copy in the mail If you have any lab test that is abnormal or we need to change your treatment, we will call you to review the results.   Testing/Procedures: Your physician has requested that you have a cardiac catheterization. Cardiac catheterization is used to diagnose and/or treat various heart conditions. Doctors may recommend this procedure for a number of different reasons. The most common reason is to evaluate chest pain. Chest pain can be a symptom of coronary artery disease (CAD), and cardiac catheterization can show whether plaque is narrowing or blocking your heart's arteries. This procedure is also used to evaluate the valves, as well as measure the blood flow and oxygen levels in different parts of your heart. For further information please visit [email or web address] Please follow instruction sheet, BELOW:        Cardiac/Peripheral Catheterization   You are scheduled for a Cardiac Catheterization on ,   CALL ME WHEN YOU ARE READY TO SCHEDULE.     A COUPLE OF DATES THAT DR. Excell Seltzer IS IN THE LAB IS 7/10 & 7/12 IF YOU PREFER ANY OF THESE, OR WE CAN SCHEDULE WITH ANYONE ON WHATEVER DATE YOU NEED TO SCHEDULE  1. Please arrive at the [facility] (Main Entrance A) at [facility]: [address] [place], [place] [address] at    (This time is 2 hour(s) before your procedure to ensure your preparation). Free valet parking service is available. You will check in at ADMITTING. The support person will be asked  to wait in the waiting room.  It is OK to have someone drop you off and come back when you are ready to be discharged.        Special note: Every effort is made to have your procedure done on time. Please understand that emergencies sometimes delay scheduled procedures.  2. Diet: Do not eat solid foods after midnight.  You may have clear liquids until 5 AM the day of the procedure.  3. Labs: You will need to have blood drawn on  TODAY  4. Medication instructions in preparation for your procedure:   Contrast Allergy: No  Stop taking, Lisinopril (Zestril or Prinivil) WILL HAVE TO BE HELD 24 HOURS PRIOR AND DO NOT TAKE THE MORNING OF    On the morning of your procedure, take Aspirin 81 mg and any morning medicines NOT listed above.  You may use sips of water.  5. Plan to go home the same day, you will only stay overnight if medically necessary. 6. You MUST have a responsible adult to drive you home. 7. An adult MUST be with you the first 24 hours after you arrive home. 8. Bring a current list of your medications, and the last time and date medication taken. 9. Bring ID and current insurance cards. 10.Please wear clothes that are easy to get on and off and wear slip-on shoes.  Thank you for allowing [place] to care for you!   -- [facility]     Follow-Up: At  [facility], you and your health needs are our priority.  As part of our continuing mission to provide you with exceptional heart care, we have created designated Provider Care Teams.  These Care Teams include your primary Cardiologist (physician) and Advanced Practice Providers (APPs -  Physician Assistants and Nurse Practitioners) who all work together to provide you with the care you need, when you need it.  We recommend signing up for the patient portal called "MyChart".  Sign up information is provided on this After Visit Summary.  MyChart is used to connect with patients for Virtual Visits  (Telemedicine).  Patients are able to view lab/test results, encounter notes, upcoming appointments, etc.  Non-urgent messages can be sent to your provider as well.   To learn more about what you can do with MyChart, go to [email or web address].    Your next appointment:   2 week(s) AFTER CATH, WILL SCHEDULE ONCE WE KNOW A DATE FOR THE CATH  Provider:   Tereso Newcomer, PA-C         Other Instructions

## 2022-12-29 LAB — CBC
Hematocrit: 43.8 % (ref 37.5–51.0)
Hemoglobin: 14.6 g/dL (ref 13.0–17.7)
MCH: 29.4 pg (ref 26.6–33.0)
MCHC: 33.3 g/dL (ref 31.5–35.7)
MCV: 88 fL (ref 79–97)
Platelets: 163 10*3/uL (ref 150–450)
RBC: 4.97 x10E6/uL (ref 4.14–5.80)
RDW: 12.5 % (ref 11.6–15.4)
WBC: 4.3 10*3/uL (ref 3.4–10.8)

## 2022-12-29 LAB — BASIC METABOLIC PANEL
BUN/Creatinine Ratio: 12 (ref 10–24)
BUN: 12 mg/dL (ref 8–27)
CO2: 22 mmol/L (ref 20–29)
Calcium: 9.1 mg/dL (ref 8.6–10.2)
Chloride: 104 mmol/L (ref 96–106)
Creatinine, Ser: 1.03 mg/dL (ref 0.76–1.27)
Glucose: 122 mg/dL — ABNORMAL HIGH (ref 70–99)
Potassium: 4.3 mmol/L (ref 3.5–5.2)
Sodium: 140 mmol/L (ref 134–144)
eGFR: 83 mL/min/{1.73_m2} (ref >59)

## 2022-12-31 NOTE — Telephone Encounter (Signed)
Returned call to pt.  Pt had already been given copy of instructions, he just needed to fill in the date / time of arrival.  Verbally went over instructions below.  Pt verbalized understanding.           Cardiac/Peripheral Catheterization   You are scheduled for a Cardiac Catheterization on Friday, July 12 with Dr. Tonny Bollman.  1. Please arrive at the [facility] (Main Entrance A) at [facility]: [address] [place], [place] [address] at 9:00 AM (This time is 2 hour(s) before your procedure to ensure your preparation). Free valet parking service is available. You will check in at ADMITTING. The support person will be asked to wait in the waiting room.  It is OK to have someone drop you off and come back when you are ready to be discharged.        Special note: Every effort is made to have your procedure done on time. Please understand that emergencies sometimes delay scheduled procedures.  2. Diet: Do not eat solid foods after midnight.  You may have clear liquids until 5 AM the day of the procedure.  3. Labs: ALREADY DONE   4. Medication instructions in preparation for your procedure:   Contrast Allergy: No   Stop taking, Lisinopril (Zestril or Prinivil) Thursday, July 11,   On the morning of your procedure, take Aspirin 81 mg and any morning medicines NOT listed above.  You may use sips of water.  5. Plan to go home the same day, you will only stay overnight if medically necessary. 6. You MUST have a responsible adult to drive you home. 7. An adult MUST be with you the first 24 hours after you arrive home. 8. Bring a current list of your medications, and the last time and date medication taken. 9. Bring ID and current insurance cards. 10.Please wear clothes that are easy to get on and off and wear slip-on shoes.  Thank you for allowing [place] to care for you!   -- [place] [facility]

## 2023-01-01 ENCOUNTER — Other Ambulatory Visit: Payer: Self-pay | Admitting: Cardiovascular Disease

## 2023-01-01 NOTE — Telephone Encounter (Signed)
Orders placed. Tereso Newcomer, PA-C    01/01/2023 4:41 PM

## 2023-01-01 NOTE — Addendum Note (Signed)
Addended byAlben Spittle, Lorin Picket T on: 01/01/2023 04:41 PM   Modules accepted: Orders

## 2023-01-10 ENCOUNTER — Telehealth: Payer: Self-pay | Admitting: *Deleted

## 2023-01-10 NOTE — Telephone Encounter (Addendum)
Cardiac Catheterization scheduled at [facility] for: Friday January 11, 2023 11 AM Arrival time [facility] Main Entrance A at: 9 AM  Nothing to eat after midnight prior to procedure, clear liquids until 5 AM day of procedure.  Medication instructions: -Usual morning medications can be taken with sips of water including aspirin 81 mg.  Confirmed patient has responsible adult to drive home post procedure and be with patient first 24 hours after arriving home.  Plan to go home the same day, you will only stay overnight if medically necessary.  Reviewed procedure instructions with patient.

## 2023-01-11 ENCOUNTER — Ambulatory Visit (HOSPITAL_COMMUNITY)
Admission: RE | Admit: 2023-01-11 | Discharge: 2023-01-11 | Disposition: A | Discharge status: Home / Self Care | Payer: BC Managed Care – PPO | Attending: Cardiovascular Disease | Admitting: Cardiovascular Disease

## 2023-01-11 ENCOUNTER — Encounter (HOSPITAL_COMMUNITY)
Admission: RE | Disposition: A | Discharge status: Home / Self Care | Payer: Self-pay | Source: Home / Self Care | Attending: Cardiovascular Disease

## 2023-01-11 ENCOUNTER — Other Ambulatory Visit: Payer: Self-pay

## 2023-01-11 DIAGNOSIS — I10 Essential (primary) hypertension: Secondary | ICD-10-CM | POA: Insufficient documentation

## 2023-01-11 DIAGNOSIS — I6523 Occlusion and stenosis of bilateral carotid arteries: Secondary | ICD-10-CM | POA: Diagnosis not present

## 2023-01-11 DIAGNOSIS — E782 Mixed hyperlipidemia: Secondary | ICD-10-CM | POA: Insufficient documentation

## 2023-01-11 DIAGNOSIS — Z79899 Other long term (current) drug therapy: Secondary | ICD-10-CM | POA: Insufficient documentation

## 2023-01-11 DIAGNOSIS — I447 Left bundle-branch block, unspecified: Secondary | ICD-10-CM | POA: Diagnosis not present

## 2023-01-11 DIAGNOSIS — I420 Dilated cardiomyopathy: Secondary | ICD-10-CM | POA: Diagnosis not present

## 2023-01-11 DIAGNOSIS — I428 Other cardiomyopathies: Secondary | ICD-10-CM | POA: Insufficient documentation

## 2023-01-11 DIAGNOSIS — I429 Cardiomyopathy, unspecified: Secondary | ICD-10-CM | POA: Diagnosis not present

## 2023-01-11 HISTORY — PX: LEFT HEART CATH AND CORONARY ANGIOGRAPHY: CATH118249

## 2023-01-11 SURGERY — LEFT HEART CATH AND CORONARY ANGIOGRAPHY
Anesthesia: LOCAL

## 2023-01-11 MED ORDER — ONDANSETRON HCL 4 MG/2ML IJ SOLN: 4.0000 mg | Freq: Four times a day (QID) | INTRAMUSCULAR | Status: DC | PRN

## 2023-01-11 MED ORDER — LABETALOL HCL 5 MG/ML IV SOLN: 10.0000 mg | INTRAVENOUS | Status: DC | PRN

## 2023-01-11 MED ORDER — SODIUM CHLORIDE 0.9 % WEIGHT BASED INFUSION: 1.0000 mL/kg/h | INTRAVENOUS | Status: DC

## 2023-01-11 MED ORDER — MIDAZOLAM HCL 2 MG/2ML IJ SOLN
INTRAMUSCULAR | Status: DC | PRN
  Administered 2023-01-11: 2 mg via INTRAVENOUS

## 2023-01-11 MED ORDER — SODIUM CHLORIDE 0.9% FLUSH: 3.0000 mL | Freq: Two times a day (BID) | INTRAVENOUS | Status: DC

## 2023-01-11 MED ORDER — VERAPAMIL HCL 2.5 MG/ML IV SOLN
INTRAVENOUS | Status: DC | PRN
  Administered 2023-01-11: 10 mL via INTRA_ARTERIAL

## 2023-01-11 MED ORDER — SODIUM CHLORIDE 0.9 % IV SOLN: 250.0000 mL | INTRAVENOUS | Status: DC | PRN

## 2023-01-11 MED ORDER — VERAPAMIL HCL 2.5 MG/ML IV SOLN
INTRAVENOUS | Status: AC
  Filled 2023-01-11: qty 2

## 2023-01-11 MED ORDER — LIDOCAINE HCL (PF) 1 % IJ SOLN
INTRAMUSCULAR | Status: DC | PRN
  Administered 2023-01-11: 2 mL

## 2023-01-11 MED ORDER — HEPARIN (PORCINE) IN NACL 1000-0.9 UT/500ML-% IV SOLN
INTRAVENOUS | Status: DC | PRN
  Administered 2023-01-11 (×2): 500 mL

## 2023-01-11 MED ORDER — ASPIRIN 81 MG PO CHEW: 81.0000 mg | CHEWABLE_TABLET | ORAL | Status: DC

## 2023-01-11 MED ORDER — HEPARIN SODIUM (PORCINE) 1000 UNIT/ML IJ SOLN
INTRAMUSCULAR | Status: AC
  Filled 2023-01-11: qty 10

## 2023-01-11 MED ORDER — HYDRALAZINE HCL 20 MG/ML IJ SOLN: 10.0000 mg | INTRAMUSCULAR | Status: DC | PRN

## 2023-01-11 MED ORDER — LIDOCAINE HCL (PF) 1 % IJ SOLN
INTRAMUSCULAR | Status: AC
  Filled 2023-01-11: qty 30

## 2023-01-11 MED ORDER — FENTANYL CITRATE (PF) 100 MCG/2ML IJ SOLN
INTRAMUSCULAR | Status: DC | PRN
  Administered 2023-01-11: 25 ug via INTRAVENOUS

## 2023-01-11 MED ORDER — FENTANYL CITRATE (PF) 100 MCG/2ML IJ SOLN
INTRAMUSCULAR | Status: AC
  Filled 2023-01-11: qty 2

## 2023-01-11 MED ORDER — HEPARIN SODIUM (PORCINE) 1000 UNIT/ML IJ SOLN
INTRAMUSCULAR | Status: DC | PRN
  Administered 2023-01-11: 5000 [IU] via INTRAVENOUS

## 2023-01-11 MED ORDER — SODIUM CHLORIDE 0.9% FLUSH: 3.0000 mL | INTRAVENOUS | Status: DC | PRN

## 2023-01-11 MED ORDER — MIDAZOLAM HCL 2 MG/2ML IJ SOLN
INTRAMUSCULAR | Status: AC
  Filled 2023-01-11: qty 2

## 2023-01-11 MED ORDER — ACETAMINOPHEN 325 MG PO TABS: 650.0000 mg | ORAL_TABLET | ORAL | Status: DC | PRN

## 2023-01-11 MED ORDER — SODIUM CHLORIDE 0.9 % WEIGHT BASED INFUSION
3.0000 mL/kg/h | INTRAVENOUS | Status: AC
  Administered 2023-01-11: 3 mL/kg/h via INTRAVENOUS

## 2023-01-11 SURGICAL SUPPLY — 9 items
CATH 5FR JL3.5 JR4 ANG PIG MP (CATHETERS) IMPLANT
DEVICE RAD COMP TR BAND LRG (VASCULAR PRODUCTS) IMPLANT
GLIDESHEATH SLEND SS 6F .021 (SHEATH) IMPLANT
GUIDEWIRE INQWIRE 1.5J.035X260 (WIRE) IMPLANT
INQWIRE 1.5J .035X260CM (WIRE) ×1
KIT HEART LEFT (KITS) ×1 IMPLANT
PACK CARDIAC CATHETERIZATION (CUSTOM PROCEDURE TRAY) ×1 IMPLANT
TRANSDUCER W/STOPCOCK (MISCELLANEOUS) ×1 IMPLANT
TUBING CIL FLEX 10 FLL-RA (TUBING) ×1 IMPLANT

## 2023-01-11 NOTE — Interval H&P Note (Signed)
History and Physical Interval Note:  01/11/2023 2:02 PM  Alexander Lamb  has presented today for surgery, with the diagnosis of cardiomyopaty.  The various methods of treatment have been discussed with the patient and family. After consideration of risks, benefits and other options for treatment, the patient has consented to  Procedure(s): LEFT HEART CATH AND CORONARY ANGIOGRAPHY (N/A) as a surgical intervention.  The patient's history has been reviewed, patient examined, no change in status, stable for surgery.  I have reviewed the patient's chart and labs.  Questions were answered to the patient's satisfaction.     Tonny Bollman

## 2023-01-11 NOTE — Discharge Instructions (Signed)

## 2023-01-11 NOTE — Progress Notes (Signed)
TR BAND REMOVAL  LOCATION:    right radial  DEFLATED PER PROTOCOL:    Yes.    TIME BAND OFF / DRESSING APPLIED:    1520 gauze dressing applied   SITE UPON ARRIVAL:    Level 0  SITE AFTER BAND REMOVAL:    Level 0  CIRCULATION SENSATION AND MOVEMENT:    Within Normal Limits   Yes.    COMMENTS:   no issues noted  

## 2023-01-11 NOTE — Progress Notes (Signed)
Assumed care of pt at this time.

## 2023-01-14 ENCOUNTER — Encounter (HOSPITAL_COMMUNITY): Payer: Self-pay | Admitting: Cardiovascular Disease

## 2023-02-05 ENCOUNTER — Encounter: Payer: Self-pay | Admitting: Physician Assistant

## 2023-02-05 DIAGNOSIS — I5022 Chronic systolic (congestive) heart failure: Secondary | ICD-10-CM

## 2023-02-05 HISTORY — DX: Chronic systolic (congestive) heart failure: I50.22

## 2023-02-05 NOTE — Progress Notes (Unsigned)
Cardiology Office Note:    Date:  02/06/2023  ID:  Alexander Lamb, DOB 04/22/1961, MRN [id number] PCP: [facility] Providers Cardiologist:  Tonny Bollman, MD Cardiology APP:  Beatrice Lecher, PA-C       Patient Profile:      Nonischemic cardiomyopathy  TTE 12/14/2022: EF 40-45, no RWMA, significant dyssynchrony due to underlying LBBB, normal RVSF, mild LAE, trivial MR, trivial AI, RAP 3 Myoview 12/19/2022: Anteroseptal and apical inferior/anterior and apical infarct, EF 46, no ischemia, intermediate risk LHC 01/11/2023: Widely patent coronaries with minimal irregularity, no significant CAD, normal LVEDP Left Bundle Branch Block  Hypertension Hyperlipidemia History of carotid artery stenosis Carotid US 11/14: Bilateral ICA 40-59 Carotid US 11/15: No ICA stenosis Carotid artery infundibula Head CTA 12/19: small b/l supraclinoid ICA outpouchings (favor infundibula over aneurysms) No f/u recommended  Hypogonadism             Discussed the use of AI scribe software for clinical note transcription with the patient, who gave verbal consent to proceed.  History of Present Illness   A 62 year old patient with a recent diagnosis of left bundle branch block on EKG presents for follow-up of HFmrEF due to non-ischemic cardiomyopathy. The patient's workup included an echocardiogram, which showed reduced LV function with an EF of 40-45%. A nuclear stress test suggested an anteroseptal apical inferior anterior and apical infarct. However, a subsequent cardiac catheterization showed no CAD. The patient is currently on amlodipine 5 mg daily and lisinopril 40 mg daily for hypertension, and rosuvastatin 10 mg daily for hyperlipidemia. The patient reports no symptoms of heart failure such as shortness of breath or swelling. He has not had chest pain, syncope. The patient reports a recent back injury from cleaning grout.      ROS: See HPI    Studies Reviewed:        Risk Assessment/Calculations:             Physical Exam:   VS:  BP 123/70   Pulse 72   Ht 5\' 6"  (1.676 m)   Wt 215 lb (97.5 kg)   SpO2 94%   BMI 34.70 kg/m    Wt Readings from Last 3 Encounters:  02/06/23 215 lb (97.5 kg)  01/11/23 210 lb (95.3 kg)  12/28/22 220 lb 6.4 oz (100 kg)    Constitutional:      Appearance: Healthy appearance. Not in distress.  Neck:     Vascular: JVD normal.  Pulmonary:     Breath sounds: Normal breath sounds. No wheezing. No rales.  Cardiovascular:     Normal rate. Regular rhythm. Normal S1. Normal S2.      Murmurs: There is no murmur.     Comments: R wrist w/o hematoma Edema:    Peripheral edema absent.  Abdominal:     Palpations: Abdomen is soft.        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 Non-Ishemic CM. Cardiac catheterization showed no CAD. NYHA I. Discussed the four pillars of heart failure management and the need to optimize GDMT. Currently on Lisinopril (ACE inhibitor) and Amlodipine for hypertension. -Start Carvedilol 3.125mg  twice daily and monitor blood pressure. -Consider reducing or discontinuing Amlodipine if hypotension occurs. -Plan to switch Lisinopril to Entresto at next visit. -Return for follow-up in 6-8 weeks to assess tolerance and titrate GDMT. -Eventually try to add SGLT2i +/-  MRA. -Recheck EF with echocardiography in the future.  Essential hypertension Well controlled on current regimen of Amlodipine 5mg  daily and Lisinopril 40mg  daily. -Continue current regimen. -Monitor blood pressure at home, especially with initiation of Carvedilol.  Hyperlipidemia, mixed On Rosuvastatin 10mg  daily. LDL optimal at 64 in July 0347. -Continue current regimen.        Dispo:  Return in about 6 weeks (around 03/20/2023) for Routine Follow Up, w/ Tereso Newcomer, PA-C.  Signed, Tereso Newcomer, PA-C

## 2023-02-06 ENCOUNTER — Ambulatory Visit: Payer: BC Managed Care – PPO | Attending: Physician Assistant | Admitting: Physician Assistant

## 2023-02-06 ENCOUNTER — Encounter: Payer: Self-pay | Admitting: Physician Assistant

## 2023-02-06 VITALS — BP 123/70 | HR 72 | Ht 66.0 in | Wt 215.0 lb

## 2023-02-06 DIAGNOSIS — E782 Mixed hyperlipidemia: Secondary | ICD-10-CM

## 2023-02-06 DIAGNOSIS — I10 Essential (primary) hypertension: Secondary | ICD-10-CM | POA: Diagnosis not present

## 2023-02-06 DIAGNOSIS — I5022 Chronic systolic (congestive) heart failure: Secondary | ICD-10-CM | POA: Diagnosis not present

## 2023-02-06 MED ORDER — CARVEDILOL 3.125 MG PO TABS: 3.1250 mg | ORAL_TABLET | Freq: Two times a day (BID) | ORAL | 3 refills | Status: DC

## 2023-02-06 NOTE — Assessment & Plan Note (Signed)
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 Non-Ishemic CM. Cardiac catheterization showed no CAD. NYHA I. Discussed the four pillars of heart failure management and the need to optimize GDMT. Currently on Lisinopril (ACE inhibitor) and Amlodipine for hypertension. -Start Carvedilol 3.125mg  twice daily and monitor blood pressure. -Consider reducing or discontinuing Amlodipine if hypotension occurs. -Plan to switch Lisinopril to Entresto at next visit. -Return for follow-up in 6-8 weeks to assess tolerance and titrate GDMT. -Eventually try to add SGLT2i +/- MRA. -Recheck EF with echocardiography in the future.

## 2023-02-06 NOTE — Assessment & Plan Note (Signed)
Well controlled on current regimen of Amlodipine 5mg  daily and Lisinopril 40mg  daily. -Continue current regimen. -Monitor blood pressure at home, especially with initiation of Carvedilol.

## 2023-02-06 NOTE — Patient Instructions (Addendum)
Medication Instructions:   START TAKING :  CARVEDILOL 3.125 MG TWICE A DAY   *If you need a refill on your cardiac medications before your next appointment, please call your pharmacy*   Lab Work: NONE ORDERED  TODAY    If you have labs (blood work) drawn today and your tests are completely normal, you will receive your results only by: MyChart Message (if you have MyChart) OR A paper copy in the mail If you have any lab test that is abnormal or we need to change your treatment, we will call you to review the results.   Testing/Procedures: NONE ORDERED  TODAY    Follow-Up: At [facility], you and your health needs are our priority.  As part of our continuing mission to provide you with exceptional heart care, we have created designated Provider Care Teams.  These Care Teams include your primary Cardiologist (physician) and Advanced Practice Providers (APPs -  Physician Assistants and Nurse Practitioners) who all work together to provide you with the care you need, when you need it.  We recommend signing up for the patient portal called "MyChart".  Sign up information is provided on this After Visit Summary.  MyChart is used to connect with patients for Virtual Visits (Telemedicine).  Patients are able to view lab/test results, encounter notes, upcoming appointments, etc.  Non-urgent messages can be sent to your provider as well.   To learn more about what you can do with MyChart, go to [email or web address].    Your next appointment:   6-8  week(s)  Provider:  Tereso Newcomer PA-C    Other Instructions  PLEASE CONTACT OFFICE IF BLOOD PRESSURE TOP NUMBER IN 100'S OR LOWER

## 2023-02-06 NOTE — Assessment & Plan Note (Signed)
On Rosuvastatin 10mg  daily. LDL optimal at 64 in July 4259. -Continue current regimen.

## 2023-02-26 ENCOUNTER — Ambulatory Visit: Payer: BC Managed Care – PPO | Admitting: Physician Assistant

## 2023-04-05 NOTE — Progress Notes (Unsigned)
Cardiology Office Note:    Date:  04/08/2023  ID:  Alexander Lamb, DOB 1961-05-30, MRN [id number] PCP: [facility] Providers Cardiologist:  Tonny Bollman, MD Cardiology APP:  Beatrice Lecher, PA-C       Patient Profile:      Nonischemic cardiomyopathy  TTE 12/14/2022: EF 40-45, no RWMA, significant dyssynchrony due to underlying LBBB, normal RVSF, mild LAE, trivial MR, trivial AI, RAP 3 Myoview 12/19/2022: Anteroseptal and apical inferior/anterior and apical infarct, EF 46, no ischemia, intermediate risk LHC 01/11/2023: Widely patent coronaries with minimal irregularity, no significant CAD, normal LVEDP Left Bundle Branch Block  Hypertension Hyperlipidemia History of carotid artery stenosis Carotid US 11/14: Bilateral ICA 40-59 Carotid US 11/15: No ICA stenosis Carotid artery infundibula Head CTA 12/19: small b/l supraclinoid ICA outpouchings (favor infundibula over aneurysms) No f/u recommended  Hypogonadism            History of Present Illness:  Discussed the use of AI scribe software for clinical note transcription with the patient, who gave verbal consent to proceed.  Alexander Lamb is a 62 y.o. male who returns for follow-up of heart failure with mild reduced ejection fraction.  He was last seen 02/06/2023.  Pt was started on Coreg. He returns for further titration of GDMT for CHF.   He is here alone.  The patient has not had any chest pain, shortness of breath, syncope, or edema. He maintains an active lifestyle, including regular jogging and weightlifting. The patient has been dealing with various personal stressors, including issues with his car and house, but these have not affected his health status.      ROS   See HPI     Studies Reviewed:        Risk Assessment/Calculations:             Physical Exam:   VS:  BP 126/76   Pulse 65   Ht 5\' 7"  (1.702 m)   Wt 218 lb 6.4 oz (99.1 kg)   SpO2 95%   BMI 34.21 kg/m    Wt  Readings from Last 3 Encounters:  04/08/23 218 lb 6.4 oz (99.1 kg)  02/06/23 215 lb (97.5 kg)  01/11/23 210 lb (95.3 kg)    Constitutional:      Appearance: Healthy appearance. Not in distress.  Neck:     Vascular: JVD normal.  Pulmonary:     Breath sounds: Normal breath sounds. No wheezing. No rales.  Cardiovascular:     Normal rate. Regular rhythm.     Murmurs: There is no murmur.  Edema:    Peripheral edema absent.  Abdominal:     Palpations: Abdomen is soft.        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  Volume status stable.  We discussed the beginning "four pillars of heart failure management."  I have recommended switching lisinopril to Entresto.  However he just filled his amlodipine and would like to finish this out before we make any changes.  I do not think he will need to continue on amlodipine 1 tab placed him on Entresto.   -Continue Carvedilol 3.125mg  twice daily. -Patient will contact me when his amlodipine supply is down to about 10 tablets. At that time, I will stop Lisinopril and start Entresto 49/51mg  twice daily (36 hours after his last dose of lisinopril) and also stop Amlodipine. -Plan  to repeat echo to assess EF once on Entresto. If EF remains reduced, consider adding SGLT2 inhibitor and/or MRA. -Follow up 2 mos Essential hypertension Controlled.  -Continue Amlodipine 5mg  daily, Carvedilol 3.125mg  twice daily, and Lisinopril 40mg  daily. -Plan to discontinue Amlodipine once we switch Lisinopril to Entresto. Hyperlipidemia, mixed Controlled.  -Continue Crestor 10mg  daily.         Dispo:  Return in 2 months (on 06/07/2023) for Routine Follow Up, w/ Tereso Newcomer, PA-C.  Signed, Tereso Newcomer, PA-C

## 2023-04-08 ENCOUNTER — Encounter: Payer: Self-pay | Admitting: Physician Assistant

## 2023-04-08 ENCOUNTER — Ambulatory Visit: Payer: BC Managed Care – PPO | Attending: Physician Assistant | Admitting: Physician Assistant

## 2023-04-08 VITALS — BP 126/76 | HR 65 | Ht 67.0 in | Wt 218.4 lb

## 2023-04-08 DIAGNOSIS — E782 Mixed hyperlipidemia: Secondary | ICD-10-CM

## 2023-04-08 DIAGNOSIS — I10 Essential (primary) hypertension: Secondary | ICD-10-CM

## 2023-04-08 DIAGNOSIS — I5022 Chronic systolic (congestive) heart failure: Secondary | ICD-10-CM | POA: Diagnosis not present

## 2023-04-08 NOTE — Assessment & Plan Note (Signed)
Controlled.  -Continue Crestor 10mg  daily.

## 2023-04-08 NOTE — Assessment & Plan Note (Signed)
NYHA class I.  Volume status stable.  We discussed the beginning "four pillars of heart failure management."  I have recommended switching lisinopril to Entresto.  However he just filled his amlodipine and would like to finish this out before we make any changes.  I do not think he will need to continue on amlodipine 1 tab placed him on Entresto.   -Continue Carvedilol 3.125mg  twice daily. -Patient will contact me when his amlodipine supply is down to about 10 tablets. At that time, I will stop Lisinopril and start Entresto 49/51mg  twice daily (36 hours after his last dose of lisinopril) and also stop Amlodipine. -Plan to repeat echo to assess EF once on Entresto. If EF remains reduced, consider adding SGLT2 inhibitor and/or MRA. -Follow up 2 mos

## 2023-04-08 NOTE — Patient Instructions (Signed)
Medication Instructions:  Your physician recommends that you continue on your current medications as directed. Please refer to the Current Medication list given to you today.  PLEASE CALL ME WHEN YOU GET DOWN TO ABOUT 10 AMLODIPINE  *If you need a refill on your cardiac medications before your next appointment, please call your pharmacy*   Lab Work: 3 If you have labs (blood work) drawn today and your tests are completely normal, you will receive your results only by: MyChart Message (if you have MyChart) OR A paper copy in the mail If you have any lab test that is abnormal or we need to change your treatment, we will call you to review the results.   Testing/Procedures: None ordered   Follow-Up: At [facility], you and your health needs are our priority.  As part of our continuing mission to provide you with exceptional heart care, we have created designated Provider Care Teams.  These Care Teams include your primary Cardiologist (physician) and Advanced Practice Providers (APPs -  Physician Assistants and Nurse Practitioners) who all work together to provide you with the care you need, when you need it.  We recommend signing up for the patient portal called "MyChart".  Sign up information is provided on this After Visit Summary.  MyChart is used to connect with patients for Virtual Visits (Telemedicine).  Patients are able to view lab/test results, encounter notes, upcoming appointments, etc.  Non-urgent messages can be sent to your provider as well.   To learn more about what you can do with MyChart, go to [email or web address].    Your next appointment:   2 month(s)  Provider:   Tereso Newcomer, PA-C         Other Instructions

## 2023-04-08 NOTE — Assessment & Plan Note (Signed)
Controlled.  -Continue Amlodipine 5mg  daily, Carvedilol 3.125mg  twice daily, and Lisinopril 40mg  daily. -Plan to discontinue Amlodipine once we switch Lisinopril to Entresto.

## 2023-05-07 ENCOUNTER — Telehealth: Payer: Self-pay

## 2023-05-07 NOTE — Telephone Encounter (Signed)
   Pre-operative Risk Assessment    Patient Name: Alexander Lamb  DOB: 22-Jul-1960 MRN: [id number]      Request for Surgical Clearance    Procedure:   Right Shoulder scope, rotator cuff repair  Date of Surgery:  Clearance TBD                                 Surgeon:  Ramond Marrow, M.D. Surgeon's Group or Practice Name:  [facility] Phone number:  [phone] Fax number:  [phone]   Type of Clearance Requested:   - Medical    Type of Anesthesia:  General    Additional requests/questions:    Garrel Ridgel   05/07/2023, 2:49 PM

## 2023-05-08 NOTE — Telephone Encounter (Signed)
   Name: Alexander Lamb  DOB: May 21, 1961  MRN: [id number]  Primary Cardiologist: Tonny Bollman, MD  Chart reviewed as part of pre-operative protocol coverage. The patient has an upcoming visit scheduled with Tereso Newcomer, PA on 06/07/2023 at which time clearance can be addressed in case there are any issues that would impact surgical recommendations. I added preop FYI to appointment note so that provider is aware to address at time of outpatient visit.  Per office protocol the cardiology provider should forward their finalized clearance decision and recommendations regarding antiplatelet therapy to the requesting party below.     I will route this message as FYI to requesting party and remove this message from the preop box as separate preop APP input not needed at this time.   Please call with any questions.  Napoleon Form, Leodis Rains, NP  05/08/2023, 9:31 AM

## 2023-06-06 NOTE — Progress Notes (Signed)
Cardiology Office Note:    Date:  06/07/2023  ID:  Alexander Lamb, DOB 07-16-1960, MRN [id number] PCP: [facility] Providers Cardiologist:  Tonny Bollman, MD Cardiology APP:  Beatrice Lecher, PA-C       Patient Profile:      Nonischemic cardiomyopathy  TTE 12/14/2022: EF 40-45, no RWMA, significant dyssynchrony due to underlying LBBB, normal RVSF, mild LAE, trivial MR, trivial AI, RAP 3 Myoview 12/19/2022: Anteroseptal and apical inferior/anterior and apical infarct, EF 46, no ischemia, intermediate risk LHC 01/11/2023: Widely patent coronaries with minimal irregularity, no significant CAD, normal LVEDP Left Bundle Branch Block  Hypertension Hyperlipidemia History of carotid artery stenosis Carotid US 11/14: Bilateral ICA 40-59 Carotid US 11/15: No ICA stenosis Carotid artery infundibula Head CTA 12/19: small b/l supraclinoid ICA outpouchings (favor infundibula over aneurysms) No f/u recommended  Hypogonadism           History of Present Illness:  Discussed the use of AI scribe software for clinical note transcription with the patient, who gave verbal consent to proceed.  Alexander Lamb is a 62 y.o. male who returns for follow up of CHF, surgical clearance. He was last seen in 04/2023. He needs R rotator cuff repair with Dr. Everardo Pacific under gen anesthesia. He is here alone. He is doing well w/o chest pain, shortness of breath, syncope. 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 It resolves quickly. He still lifts weights. He hurt his back 6 weeks ago. But before that, he was jogging 4-5 miles several days a week.       ROS   See HPI     Studies Reviewed:   EKG personally reviewed.  NSR, HR 66, left bundle branch block, no change from prior tracing            Risk Assessment/Calculations:             Physical Exam:   VS:  BP 123/80   Pulse 66   Ht 5\' 7"  (1.702 m)   Wt 215 lb (97.5  kg)   SpO2 97%   BMI 33.67 kg/m    Wt Readings from Last 3 Encounters:  06/07/23 215 lb (97.5 kg)  04/08/23 218 lb 6.4 oz (99.1 kg)  02/06/23 215 lb (97.5 kg)    Constitutional:      Appearance: Healthy appearance. Not in distress.  Neck:     Vascular: JVD normal.  Pulmonary:     Breath sounds: Normal breath sounds. No wheezing. No rales.  Cardiovascular:     Normal rate. Regular rhythm.     Murmurs: There is no murmur.  Edema:    Peripheral edema absent.  Abdominal:     Palpations: Abdomen is soft.         Assessment and Plan:   Assessment & Plan Heart failure with mildly reduced ejection fraction (HFmrEF) (HCC) AHA stage B. NYHA class I.  Volume status normal.  He is almost out of Amlodipine.   -Continue Carvedilol 3.125mg  twice daily. -Finish Amlodipine and stop. Stop Lisinopril at the same time. -Start Entresto 49/51mg  twice daily (36 hours after his last dose of lisinopril)   -BMET 1 month -Limited TTE in 3 mos. -Consider adding SGLT2 inhibitor and/or MRA if EF worse or no change -Follow up 6 mos. Preoperative cardiovascular examination Mr. Hausmann perioperative risk of a major cardiac event is 0.4% according to the Revised Cardiac  Risk Index (RCRI).  Therefore, he is at low risk for perioperative complications.   His functional capacity is excellent at 5.11 METs according to the Duke Activity Status Index (DASI). Recommendations: According to ACC/AHA guidelines, no further cardiovascular testing needed.  The patient may proceed to surgery at acceptable risk.   Essential hypertension BP well controlled. Continue Carvedilol 3.125 mg twice daily. DC Amlodipine, Lisinopril and start Entresto as noted.       Dispo:  Return in about 6 months (around 12/06/2023) for Routine Follow Up, w/ Dr. Excell Seltzer, or Tereso Newcomer, PA-C.  Signed, Tereso Newcomer, PA-C

## 2023-06-07 ENCOUNTER — Encounter: Payer: Self-pay | Admitting: Physician Assistant

## 2023-06-07 ENCOUNTER — Other Ambulatory Visit: Payer: Self-pay | Admitting: *Deleted

## 2023-06-07 ENCOUNTER — Other Ambulatory Visit: Payer: Self-pay

## 2023-06-07 ENCOUNTER — Ambulatory Visit: Payer: BC Managed Care – PPO | Attending: Physician Assistant | Admitting: Physician Assistant

## 2023-06-07 VITALS — BP 123/80 | HR 66 | Ht 67.0 in | Wt 215.0 lb

## 2023-06-07 DIAGNOSIS — I10 Essential (primary) hypertension: Secondary | ICD-10-CM

## 2023-06-07 DIAGNOSIS — Z01810 Encounter for preprocedural cardiovascular examination: Secondary | ICD-10-CM

## 2023-06-07 DIAGNOSIS — I5022 Chronic systolic (congestive) heart failure: Secondary | ICD-10-CM

## 2023-06-07 MED ORDER — ENTRESTO 49-51 MG PO TABS: 1.0000 | ORAL_TABLET | Freq: Two times a day (BID) | ORAL | 1 refills | Status: DC

## 2023-06-07 NOTE — Patient Instructions (Signed)
Medication Instructions:  ONCE YOU FINISH WITH NORVASC, STOP IT AND THE LISINOPRIL AT THE SAME TIME.  START ENTRESTO 49/51 TAKING 1 TWICE A DAY *If you need a refill on your cardiac medications before your next appointment, please call your pharmacy*   Lab Work: 1 MONTH: GO TO A [facility] FOR:  BMET  If you have labs (blood work) drawn today and your tests are completely normal, you will receive your results only by: MyChart Message (if you have MyChart) OR A paper copy in the mail If you have any lab test that is abnormal or we need to change your treatment, we will call you to review the results.   Testing/Procedures: Your physician has requested that you have an Limited echocardiogram in March 2025 Echocardiography is a painless test that uses sound waves to create images of your heart. It provides your doctor with information about the size and shape of your heart and how well your heart's chambers and valves are working. This procedure takes approximately one hour. There are no restrictions for this procedure. Please do NOT wear cologne, perfume, aftershave, or lotions (deodorant is allowed). Please arrive 15 minutes prior to your appointment time.  Please note: We ask at that you not bring children with you during ultrasound (echo/ vascular) testing. Due to room size and safety concerns, children are not allowed in the ultrasound rooms during exams. Our front office staff cannot provide observation of children in our lobby area while testing is being conducted. An adult accompanying a patient to their appointment will only be allowed in the ultrasound room at the discretion of the ultrasound technician under special circumstances. We apologize for any inconvenience.    Follow-Up: At [facility], you and your health needs are our priority.  As part of our continuing mission to provide you with exceptional heart care, we have created designated Provider Care Teams.  These Care  Teams include your primary Cardiologist (physician) and Advanced Practice Providers (APPs -  Physician Assistants and Nurse Practitioners) who all work together to provide you with the care you need, when you need it.  We recommend signing up for the patient portal called "MyChart".  Sign up information is provided on this After Visit Summary.  MyChart is used to connect with patients for Virtual Visits (Telemedicine).  Patients are able to view lab/test results, encounter notes, upcoming appointments, etc.  Non-urgent messages can be sent to your provider as well.   To learn more about what you can do with MyChart, go to [email or web address].    Your next appointment:   6 month(s)  Provider:   Tonny Bollman, MD  or Tereso Newcomer, PA-C         Other Instructions

## 2023-06-07 NOTE — Assessment & Plan Note (Signed)
BP well controlled. Continue Carvedilol 3.125 mg twice daily. DC Amlodipine, Lisinopril and start Entresto as noted.

## 2023-06-07 NOTE — Assessment & Plan Note (Addendum)
AHA stage B. NYHA class I.  Volume status normal.  He is almost out of Amlodipine.   -Continue Carvedilol 3.125mg  twice daily. -Finish Amlodipine and stop. Stop Lisinopril at the same time. -Start Entresto 49/51mg  twice daily (36 hours after his last dose of lisinopril)   -BMET 1 month -Limited TTE in 3 mos. -Consider adding SGLT2 inhibitor and/or MRA if EF worse or no change -Follow up 6 mos.

## 2023-07-24 ENCOUNTER — Telehealth: Payer: Self-pay | Admitting: Cardiovascular Disease

## 2023-07-24 NOTE — Telephone Encounter (Signed)
Patient reports that he went to his local pharmacy to pick up [facility], however they did not have it in stock and recommended that he get it from somewhere else. [facility] [address] and when he went to pick it up from there today he states that he was told that his insurance would not authorize it. Advised that I would send a message to our prior auth team to look into.

## 2023-07-24 NOTE — Telephone Encounter (Signed)
Pt c/o medication issue:  1. Name of Medication: sacubitril-valsartan (ENTRESTO) 49-51 MG   2. How are you currently taking this medication (dosage and times per day)? As written  3. Are you having a reaction (difficulty breathing--STAT)? no  4. What is your medication issue? He is having problems getting medication filled at pharmacy

## 2023-07-25 ENCOUNTER — Telehealth: Payer: Self-pay | Admitting: Pharmacy Technician

## 2023-07-25 ENCOUNTER — Other Ambulatory Visit (HOSPITAL_COMMUNITY): Payer: Self-pay

## 2023-07-25 MED ORDER — ENTRESTO 49-51 MG PO TABS: 1.0000 | ORAL_TABLET | Freq: Two times a day (BID) | ORAL | 1 refills | Status: DC

## 2023-07-25 NOTE — Telephone Encounter (Signed)
Spoke with the patient and he would like to try and get his Entresto from [place]. Prescription as been sent in.

## 2023-07-25 NOTE — Telephone Encounter (Signed)
Pharmacy Patient Advocate Encounter   Received notification from Pt Calls Messages that prior authorization for entresto is required/requested.   Insurance verification completed.   The patient is insured through [facility]  .   Per test claim: The current 07/25/23 day co-pay is, $240.00- 3 months .  No PA needed at this time. This test claim was processed through [facility]- copay amounts may vary at other pharmacies due to pharmacy/plan contracts, or as the patient moves through the different stages of their insurance plan.     [facility] [address] [facility] [employer] Patient will need to fill at another [facility] location

## 2023-09-04 LAB — BASIC METABOLIC PANEL
BUN/Creatinine Ratio: 17 (ref 10–24)
BUN: 17 mg/dL (ref 8–27)
CO2: 24 mmol/L (ref 20–29)
Calcium: 8.8 mg/dL (ref 8.6–10.2)
Chloride: 105 mmol/L (ref 96–106)
Creatinine, Ser: 1.02 mg/dL (ref 0.76–1.27)
Glucose: 118 mg/dL — ABNORMAL HIGH (ref 70–99)
Potassium: 4.6 mmol/L (ref 3.5–5.2)
Sodium: 142 mmol/L (ref 134–144)
eGFR: 83 mL/min/{1.73_m2} (ref >59)

## 2023-09-18 ENCOUNTER — Telehealth: Payer: Self-pay | Admitting: Cardiovascular Disease

## 2023-09-18 NOTE — Telephone Encounter (Signed)
 Patient stated he is returning a phone call.

## 2023-09-18 NOTE — Telephone Encounter (Signed)
 Left a message for the pt... unsure who tried to call him.

## 2023-09-23 ENCOUNTER — Other Ambulatory Visit: Payer: Self-pay

## 2023-09-23 MED ORDER — CARVEDILOL 3.125 MG PO TABS: 3.1250 mg | ORAL_TABLET | Freq: Two times a day (BID) | ORAL | 2 refills | Status: DC

## 2023-09-27 ENCOUNTER — Ambulatory Visit (HOSPITAL_COMMUNITY): Payer: BC Managed Care – PPO | Attending: Internal Medicine

## 2023-09-27 DIAGNOSIS — Z01810 Encounter for preprocedural cardiovascular examination: Secondary | ICD-10-CM

## 2023-09-27 DIAGNOSIS — I10 Essential (primary) hypertension: Secondary | ICD-10-CM | POA: Diagnosis present

## 2023-09-27 DIAGNOSIS — I5022 Chronic systolic (congestive) heart failure: Secondary | ICD-10-CM

## 2023-09-27 LAB — ECHOCARDIOGRAM LIMITED
Area-P 1/2: 3.3 cm2
S' Lateral: 3.55 cm

## 2023-10-01 ENCOUNTER — Telehealth: Payer: Self-pay | Admitting: *Deleted

## 2023-10-01 ENCOUNTER — Other Ambulatory Visit (HOSPITAL_COMMUNITY): Payer: Self-pay

## 2023-10-01 ENCOUNTER — Other Ambulatory Visit: Payer: Self-pay | Admitting: *Deleted

## 2023-10-01 ENCOUNTER — Telehealth: Payer: Self-pay | Admitting: Pharmacy Technician

## 2023-10-01 DIAGNOSIS — Z79899 Other long term (current) drug therapy: Secondary | ICD-10-CM

## 2023-10-01 MED ORDER — EMPAGLIFLOZIN 10 MG PO TABS: 10.0000 mg | ORAL_TABLET | Freq: Every day | ORAL | 0 refills | Status: DC

## 2023-10-01 MED ORDER — EMPAGLIFLOZIN 10 MG PO TABS: 10.0000 mg | ORAL_TABLET | Freq: Every day | ORAL | 3 refills | Status: DC

## 2023-10-01 NOTE — Telephone Encounter (Signed)
-----   Message from Tereso Newcomer sent at 09/30/2023 10:53 AM EDT ----- Results sent to Alexander Lamb via MyChart. See MyChart comments below. PLAN:  -Start Jardiance 10 mg once daily (provide samples and prescription) -BMET 2-3 weeks after starting -Schedule follow up with me 3 mos  Alexander Lamb  Your echocardiogram shows slight improvement in your ejection fraction (heart function).  Previous echocardiogram demonstrated an ejection fraction of 40-45%.  Ejection fraction on this study is 45-50%.  Normal is 60%.  I do think that there would be a benefit to trying to maximize your medical therapy as much as possible.  You are already on excellent medications with carvedilol and Entresto.  To further advance this, I think we should add Jardiance 10 mg daily.  We can provide you with samples and a prescription.  We would need to check blood work about 2 to 3 weeks after starting this. There is a very rare side effect of causing urinary tract infections. So, if you notice burning, frequency, urgency or skin redness/changes, you should stop the Jardiance and let us know.  Tereso Newcomer, PA-C

## 2023-10-01 NOTE — Telephone Encounter (Signed)
 Pharmacy Patient Advocate Encounter  Received notification from [employer] that Prior Authorization for Alexander Lamb has been APPROVED from 09/01/23 to 09/30/24. Ran test claim, Copay is $30.00- one month. This test claim was processed through [facility]- copay amounts may vary at other pharmacies due to pharmacy/plan contracts, or as the patient moves through the different stages of their insurance plan.   PA #/Case ID/Reference #: [id number]

## 2023-10-01 NOTE — Telephone Encounter (Signed)
 Pharmacy Patient Advocate Encounter   Received notification from CoverMyMeds that prior authorization for Jardiance is required/requested.   Insurance verification completed.   The patient is insured through [employer] .   Per test claim: PA required; PA submitted to above mentioned insurance via CoverMyMeds Key/confirmation #/EOC [id number] Status is pending

## 2023-11-28 ENCOUNTER — Telehealth: Payer: Self-pay | Admitting: Physician Assistant

## 2023-11-28 DIAGNOSIS — I5022 Chronic systolic (congestive) heart failure: Secondary | ICD-10-CM

## 2023-11-28 MED ORDER — ENTRESTO 49-51 MG PO TABS: 1.0000 | ORAL_TABLET | Freq: Two times a day (BID) | ORAL | 3 refills | Status: AC

## 2023-11-28 NOTE — Telephone Encounter (Signed)
 Spoke with patient and he states he will need a 90 supply sent to [facility]. His copay is more money when its only a 30 day supply  Sent 90 day supply to pharmacy.

## 2023-11-28 NOTE — Telephone Encounter (Signed)
 Left voicemail to return call to office

## 2023-11-28 NOTE — Telephone Encounter (Signed)
 Pt c/o medication issue:  1. Name of Medication:   sacubitril-valsartan (ENTRESTO ) 49-51 MG    2. How are you currently taking this medication (dosage and times per day)? As written   3. Are you having a reaction (difficulty breathing--STAT)? No   4. What is your medication issue? Pt called in stating [facility] told him his copay is around $600 for this medication. He states he received a letter from his insurance that he needs to get this filled "at a participating locations for 3 month supply." He does not know what participating locations are. Please advise.

## 2023-11-28 NOTE — Telephone Encounter (Signed)
 Pt returning nurse call

## 2023-12-23 ENCOUNTER — Other Ambulatory Visit: Payer: Self-pay

## 2023-12-23 MED ORDER — EMPAGLIFLOZIN 10 MG PO TABS: 10.0000 mg | ORAL_TABLET | Freq: Every day | ORAL | 1 refills | Status: DC

## 2023-12-26 ENCOUNTER — Ambulatory Visit: Payer: Self-pay | Admitting: Physician Assistant

## 2023-12-26 LAB — BASIC METABOLIC PANEL WITH GFR
BUN/Creatinine Ratio: 16 (ref 10–24)
BUN: 17 mg/dL (ref 8–27)
CO2: 20 mmol/L (ref 20–29)
Calcium: 8.8 mg/dL (ref 8.6–10.2)
Chloride: 106 mmol/L (ref 96–106)
Creatinine, Ser: 1.09 mg/dL (ref 0.76–1.27)
Glucose: 109 mg/dL — ABNORMAL HIGH (ref 70–99)
Potassium: 4.1 mmol/L (ref 3.5–5.2)
Sodium: 142 mmol/L (ref 134–144)
eGFR: 77 mL/min/{1.73_m2} (ref >59)

## 2023-12-27 ENCOUNTER — Ambulatory Visit: Admitting: Physician Assistant

## 2023-12-30 ENCOUNTER — Other Ambulatory Visit: Payer: Self-pay

## 2023-12-30 ENCOUNTER — Encounter: Payer: Self-pay | Admitting: Physician Assistant

## 2023-12-30 ENCOUNTER — Ambulatory Visit: Attending: Physician Assistant | Admitting: Physician Assistant

## 2023-12-30 VITALS — BP 123/70 | HR 70 | Ht 67.0 in | Wt 220.0 lb

## 2023-12-30 DIAGNOSIS — I428 Other cardiomyopathies: Secondary | ICD-10-CM

## 2023-12-30 MED ORDER — EMPAGLIFLOZIN 10 MG PO TABS: 10.0000 mg | ORAL_TABLET | Freq: Every day | ORAL | 3 refills | Status: DC

## 2023-12-30 MED ORDER — EMPAGLIFLOZIN 10 MG PO TABS: 10.0000 mg | ORAL_TABLET | Freq: Every day | ORAL | 1 refills | Status: DC

## 2023-12-30 NOTE — Patient Instructions (Signed)
 Medication Instructions:  Your physician recommends that you continue on your current medications as directed. Please refer to the Current Medication list given to you today.  *If you need a refill on your cardiac medications before your next appointment, please call your pharmacy*  Lab Work: None ordered  If you have labs (blood work) drawn today and your tests are completely normal, you will receive your results only by: MyChart Message (if you have MyChart) OR A paper copy in the mail If you have any lab test that is abnormal or we need to change your treatment, we will call you to review the results.  Testing/Procedures: None ordered  Follow-Up: At [facility], you and your health needs are our priority.  As part of our continuing mission to provide you with exceptional heart care, our providers are all part of one team.  This team includes your primary Cardiologist (physician) and Advanced Practice Providers or APPs (Physician Assistants and Nurse Practitioners) who all work together to provide you with the care you need, when you need it.  Your next appointment:   12 month(s)  Provider:   Ozell Fell, MD or Glendia Ferrier, PA-C          We recommend signing up for the patient portal called MyChart.  Sign up information is provided on this After Visit Summary.  MyChart is used to connect with patients for Virtual Visits (Telemedicine).  Patients are able to view lab/test results, encounter notes, upcoming appointments, etc.  Non-urgent messages can be sent to your provider as well.   To learn more about what you can do with MyChart, go to [email or web address].   Other Instructions

## 2023-12-30 NOTE — Progress Notes (Signed)
    OFFICE NOTE:    Date:  12/30/2023  ID:  CHANSE KAGEL, DOB 1960/11/22, MRN [id number] PCP: [facility] Providers Cardiologist:  Ozell Fell, MD Cardiology APP:  Lelon Glendia DASEN, PA-C       Patient Profile:  Nonischemic cardiomyopathy  TTE 12/14/2022: EF 40-45, no RWMA, significant dyssynchrony due to underlying LBBB, normal RVSF, mild LAE, trivial MR, trivial AI, RAP 3 Myoview  12/19/2022: Anteroseptal and apical inferior/anterior and apical infarct, EF 46, no ischemia, intermediate risk LHC 01/11/2023: Widely patent coronaries with minimal irregularity, no significant CAD, normal LVEDP Limited TTE 09/27/2023: EF 45-50, GR 1 DD, normal RVSF, trivial MR, trivial AI, AV sclerosis  Left Bundle Branch Block  Hypertension Hyperlipidemia History of carotid artery stenosis Carotid US  11/14: Bilateral ICA 40-59 Carotid US  11/15: No ICA stenosis Carotid artery infundibula Head CTA 12/19: small b/l supraclinoid ICA outpouchings (favor infundibula over aneurysms) No f/u recommended  Hypogonadism         Discussed the use of AI scribe software for clinical note transcription with the patient, who gave verbal consent to proceed. History of Present Illness Alexander Lamb is a 63 y.o. male who returns for follow up of non-ischemic cardiomyopathy. He is here alone.  He is active with no symptoms of shortness of breath, chest pain, syncope, or leg swelling. He works as a Comptroller at [employer].    ROS-See HPI    Studies Reviewed:             Physical Exam:  VS:  BP 123/70   Pulse 70   Ht 5' 7 (1.702 m)   Wt 220 lb (99.8 kg)   SpO2 96%   BMI 34.46 kg/m        Wt Readings from Last 3 Encounters:  12/30/23 220 lb (99.8 kg)  06/07/23 215 lb (97.5 kg)  04/08/23 218 lb 6.4 oz (99.1 kg)    Constitutional:      Appearance: Healthy appearance. Not in distress.  Neck:     Vascular: JVD normal.   Pulmonary:     Breath sounds: Normal breath sounds. No wheezing. No rales.  Cardiovascular:     Normal rate. Regular rhythm.     Murmurs: There is no murmur.  Edema:    Peripheral edema absent.  Abdominal:     Palpations: Abdomen is soft.        Assessment and Plan:    Assessment & Plan NICM (nonischemic cardiomyopathy) (HCC) EF improved from 40-45 in 12/2022 to 45-50 in 08/2023. He is AHA stage B, NYHA I. He is overall doing well. I do not think he needs further testing and continuing current GDMT. - Continue carvedilol  3.125 mg twice daily - Continue Entresto  49/51 mg twice daily - Continue Jardiance  - Follow up 1 year.         Dispo:  Return in about 1 year (around 12/29/2024) for Routine Follow Up, w/ Dr. Fell, or Glendia Lelon, PA-C.  Signed, Glendia Lelon, PA-C

## 2023-12-30 NOTE — Assessment & Plan Note (Signed)
 EF improved from 40-45 in 12/2022 to 45-50 in 08/2023. He is AHA stage B, NYHA I. He is overall doing well. I do not think he needs further testing and continuing current GDMT. - Continue carvedilol  3.125 mg twice daily - Continue Entresto  49/51 mg twice daily - Continue Jardiance  - Follow up 1 year.

## 2024-01-13 ENCOUNTER — Telehealth: Payer: Self-pay | Admitting: Physician Assistant

## 2024-01-13 NOTE — Telephone Encounter (Signed)
 Left message to call back triage.

## 2024-01-13 NOTE — Telephone Encounter (Signed)
 Pt c/o BP issue: STAT if pt c/o blurred vision, one-sided weakness or slurred speech.  STAT if BP is GREATER than 180/120 TODAY.  STAT if BP is LESS than 90/60 and SYMPTOMATIC TODAY  1. What is your BP concern? Pt concerned his bp has been high for about 3-4 days   2. Have you taken any BP medication today? Yes   3. What are your last 5 BP readings?  143/74 - yesterday  133/73 - today   4. Are you having any other symptoms (ex. Dizziness, headache, blurred vision, passed out)? Dizziness

## 2024-02-14 ENCOUNTER — Other Ambulatory Visit: Payer: Self-pay | Admitting: Cardiovascular Disease

## 2024-06-02 ENCOUNTER — Other Ambulatory Visit: Payer: Self-pay | Admitting: Physician Assistant

## 2024-06-09 ENCOUNTER — Other Ambulatory Visit: Payer: Self-pay | Admitting: Physician Assistant

## 2024-07-17 ENCOUNTER — Other Ambulatory Visit: Payer: Self-pay | Admitting: Urology

## 2024-07-17 DIAGNOSIS — R319 Hematuria, unspecified: Secondary | ICD-10-CM

## 2024-07-30 ENCOUNTER — Ambulatory Visit
Admission: RE | Admit: 2024-07-30 | Discharge: 2024-07-30 | Disposition: A | Discharge status: Home / Self Care | Source: Ambulatory Visit | Attending: Urology | Admitting: Urology

## 2024-07-30 DIAGNOSIS — R319 Hematuria, unspecified: Secondary | ICD-10-CM

## 2024-07-30 MED ORDER — IOPAMIDOL (ISOVUE-300) INJECTION 61%
125.0000 mL | Freq: Once | INTRAVENOUS | Status: AC | PRN
  Administered 2024-07-30: 125 mL via INTRAVENOUS
